# Patient Record
Sex: Male | Born: 1975 | Race: White | Hispanic: No | Marital: Married | Smoking: Current every day smoker
Health system: Southern US, Community
[De-identification: ages and names within clinical notes are randomized; demographics above are authoritative.]

## PROBLEM LIST (undated history)

## (undated) DIAGNOSIS — L729 Follicular cyst of the skin and subcutaneous tissue, unspecified: Secondary | ICD-10-CM

## (undated) DIAGNOSIS — K279 Peptic ulcer, site unspecified, unspecified as acute or chronic, without hemorrhage or perforation: Secondary | ICD-10-CM

## (undated) DIAGNOSIS — M549 Dorsalgia, unspecified: Secondary | ICD-10-CM

## (undated) DIAGNOSIS — G8929 Other chronic pain: Secondary | ICD-10-CM

## (undated) DIAGNOSIS — B001 Herpesviral vesicular dermatitis: Secondary | ICD-10-CM

## (undated) HISTORY — DX: Follicular cyst of the skin and subcutaneous tissue, unspecified: L72.9

## (undated) HISTORY — DX: Dorsalgia, unspecified: M54.9

## (undated) HISTORY — DX: Other chronic pain: G89.29

## (undated) HISTORY — DX: Herpesviral vesicular dermatitis: B00.1

## (undated) MED FILL — Medication: Fill #1 | Status: CN

---

## 1998-01-09 ENCOUNTER — Emergency Department (HOSPITAL_COMMUNITY): Admission: EM | Admit: 1998-01-09 | Discharge: 1998-01-09 | Payer: Self-pay | Admitting: Emergency Medicine

## 2000-03-22 HISTORY — PX: CHOLECYSTECTOMY: SHX55

## 2008-02-14 ENCOUNTER — Emergency Department: Payer: Self-pay | Admitting: Emergency Medicine

## 2009-11-20 ENCOUNTER — Ambulatory Visit: Payer: Self-pay | Admitting: Family Medicine

## 2010-08-05 ENCOUNTER — Ambulatory Visit: Payer: Self-pay | Admitting: Specialist

## 2013-04-10 LAB — LIPID PANEL: LDL Cholesterol: 84 mg/dL

## 2014-03-25 LAB — LIPID PANEL
Cholesterol: 247 mg/dL — AB (ref 0–200)
HDL: 34 mg/dL — AB (ref 35–70)
Triglycerides: 556 mg/dL — AB (ref 40–160)

## 2014-03-25 LAB — BASIC METABOLIC PANEL
BUN: 14 mg/dL (ref 4–21)
Creatinine: 0.9 mg/dL (ref <=1.3)
Glucose: 87 mg/dL
Sodium: 140 mmol/L (ref 137–147)

## 2014-07-29 ENCOUNTER — Emergency Department: Payer: No Typology Code available for payment source

## 2014-07-29 ENCOUNTER — Encounter: Payer: Self-pay | Admitting: Emergency Medicine

## 2014-07-29 ENCOUNTER — Emergency Department
Admission: EM | Admit: 2014-07-29 | Discharge: 2014-07-29 | Disposition: A | Discharge status: Home / Self Care | Payer: No Typology Code available for payment source | Attending: Emergency Medicine | Admitting: Emergency Medicine

## 2014-07-29 ENCOUNTER — Other Ambulatory Visit: Payer: Self-pay

## 2014-07-29 DIAGNOSIS — Z9089 Acquired absence of other organs: Secondary | ICD-10-CM | POA: Diagnosis not present

## 2014-07-29 DIAGNOSIS — R1012 Left upper quadrant pain: Secondary | ICD-10-CM

## 2014-07-29 DIAGNOSIS — Z720 Tobacco use: Secondary | ICD-10-CM | POA: Diagnosis not present

## 2014-07-29 DIAGNOSIS — Z79899 Other long term (current) drug therapy: Secondary | ICD-10-CM | POA: Diagnosis not present

## 2014-07-29 DIAGNOSIS — K2970 Gastritis, unspecified, without bleeding: Secondary | ICD-10-CM | POA: Diagnosis not present

## 2014-07-29 DIAGNOSIS — R109 Unspecified abdominal pain: Secondary | ICD-10-CM

## 2014-07-29 HISTORY — DX: Peptic ulcer, site unspecified, unspecified as acute or chronic, without hemorrhage or perforation: K27.9

## 2014-07-29 LAB — COMPREHENSIVE METABOLIC PANEL
ALBUMIN: 4.5 g/dL (ref 3.5–5.0)
ALT: 23 U/L (ref 17–63)
AST: 22 U/L (ref 15–41)
Alkaline Phosphatase: 62 U/L (ref 38–126)
Anion gap: 10 (ref 5–15)
BILIRUBIN TOTAL: 0.6 mg/dL (ref 0.3–1.2)
BUN: 21 mg/dL — ABNORMAL HIGH (ref 6–20)
CHLORIDE: 103 mmol/L (ref 101–111)
CO2: 22 mmol/L (ref 22–32)
CREATININE: 1.07 mg/dL (ref 0.61–1.24)
Calcium: 8.9 mg/dL (ref 8.9–10.3)
GFR calc Af Amer: >60 mL/min (ref >60)
GFR calc non Af Amer: >60 mL/min (ref >60)
Glucose, Bld: 110 mg/dL — ABNORMAL HIGH (ref 65–99)
Potassium: 3.3 mmol/L — ABNORMAL LOW (ref 3.5–5.1)
Sodium: 135 mmol/L (ref 135–145)
Total Protein: 7 g/dL (ref 6.5–8.1)

## 2014-07-29 LAB — CBC
HCT: 45.3 % (ref 40.0–52.0)
HEMOGLOBIN: 16.1 g/dL (ref 13.0–18.0)
MCH: 33.1 pg (ref 26.0–34.0)
MCHC: 35.5 g/dL (ref 32.0–36.0)
MCV: 93.2 fL (ref 80.0–100.0)
Platelets: 189 10*3/uL (ref 150–440)
RBC: 4.86 MIL/uL (ref 4.40–5.90)
RDW: 12.3 % (ref 11.5–14.5)
WBC: 11 10*3/uL — AB (ref 3.8–10.6)

## 2014-07-29 LAB — TROPONIN I: Troponin I: <0.03 ng/mL (ref <0.031)

## 2014-07-29 LAB — LIPASE, BLOOD: LIPASE: 26 U/L (ref 22–51)

## 2014-07-29 MED ORDER — GI COCKTAIL ~~LOC~~
ORAL | Status: AC
  Filled 2014-07-29: qty 30

## 2014-07-29 MED ORDER — OXYCODONE-ACETAMINOPHEN 5-325 MG PO TABS
ORAL_TABLET | ORAL | Status: AC
  Filled 2014-07-29: qty 1

## 2014-07-29 MED ORDER — ONDANSETRON HCL 4 MG/2ML IJ SOLN
INTRAMUSCULAR | Status: AC
  Filled 2014-07-29: qty 2

## 2014-07-29 MED ORDER — LIDOCAINE VISCOUS 2 % MT SOLN: 10.0000 mL | OROMUCOSAL | Status: DC | PRN

## 2014-07-29 MED ORDER — ONDANSETRON HCL 4 MG/2ML IJ SOLN
4.0000 mg | Freq: Once | INTRAMUSCULAR | Status: AC
  Administered 2014-07-29: 4 mg via INTRAVENOUS

## 2014-07-29 MED ORDER — FAMOTIDINE 20 MG PO TABS
ORAL_TABLET | ORAL | Status: AC
  Filled 2014-07-29: qty 1

## 2014-07-29 MED ORDER — OXYCODONE-ACETAMINOPHEN 5-325 MG PO TABS
2.0000 | ORAL_TABLET | Freq: Once | ORAL | Status: AC
  Administered 2014-07-29: 2 via ORAL

## 2014-07-29 MED ORDER — FAMOTIDINE 20 MG PO TABS
40.0000 mg | ORAL_TABLET | Freq: Once | ORAL | Status: AC
  Administered 2014-07-29: 40 mg via ORAL

## 2014-07-29 MED ORDER — MORPHINE SULFATE 4 MG/ML IJ SOLN
INTRAMUSCULAR | Status: AC
  Filled 2014-07-29: qty 1

## 2014-07-29 MED ORDER — OXYCODONE-ACETAMINOPHEN 5-325 MG PO TABS
ORAL_TABLET | ORAL | Status: AC
  Administered 2014-07-29: 2 via ORAL
  Filled 2014-07-29: qty 1

## 2014-07-29 MED ORDER — FAMOTIDINE 40 MG PO TABS: 40.0000 mg | ORAL_TABLET | Freq: Every evening | ORAL | Status: DC

## 2014-07-29 MED ORDER — MORPHINE SULFATE 4 MG/ML IJ SOLN
4.0000 mg | Freq: Once | INTRAMUSCULAR | Status: AC
  Administered 2014-07-29: 4 mg via INTRAVENOUS

## 2014-07-29 MED ORDER — GI COCKTAIL ~~LOC~~
30.0000 mL | Freq: Once | ORAL | Status: AC
  Administered 2014-07-29: 30 mL via ORAL

## 2014-07-29 MED ORDER — SODIUM CHLORIDE 0.9 % IV BOLUS (SEPSIS)
1000.0000 mL | Freq: Once | INTRAVENOUS | Status: AC
  Administered 2014-07-29: 1000 mL via INTRAVENOUS

## 2014-07-29 MED ORDER — FAMOTIDINE 20 MG PO TABS
ORAL_TABLET | ORAL | Status: AC
  Administered 2014-07-29: 40 mg via ORAL
  Filled 2014-07-29: qty 1

## 2014-07-29 NOTE — ED Notes (Signed)
Pt presents to ER alert and in NAD. Pt reports generalized abd pain, states hx of PUD. States nausea, denies vomiting, denies diarrhea. Taking Carafate at home.

## 2014-07-29 NOTE — ED Provider Notes (Signed)
[facility] Emergency Department Provider Note  ____________________________________________  Time seen: Approximately 235 AM  I have reviewed the triage vital signs and the nursing notes.   HISTORY  Chief Complaint Abdominal Pain    HPI Todd Chen is a 39 y.o. male comes in with indigestion. He reports that a few weeks ago he had some indigestion. He reports that he went to see the doctor at his physician's office and was given a prescription for Carafate. He reports that last night he ate ice cream, something healthy for lunch, and then had a [employer] sandwich. He reports that he took Prilosec and Carafate but he developed some severe epigastric and left upper quadrant abdominal pain. He reports he had similar symptoms previously but was able to alleviate the pain after vomiting. The patient reports that he also took some ibuprofen but the pain continued to worsen. He reports that he tried to lay on his stomach and felt that so he had a ball in his abdomen. He reports he is being treated for peptic ulcer disease but has not yet received an endoscopy. The patient reports that nothing he does is improving the pain so he decided to come in for further evaluation. The patient reports the pain is 8 out of 10 in intensity. He reports that it occasionally does move into his chest from his abdomen. He reports that he is breathing hard due to the pain. He denies any other complaints.   Past Medical History  Diagnosis Date  . PUD (peptic ulcer disease)   . Hypercholesteremia     There are no active problems to display for this patient.   Past Surgical History  Procedure Laterality Date  . Cholecystectomy      Current Outpatient Rx  Name  Route  Sig  Dispense  Refill  . ALPRAZolam (XANAX) 1 MG tablet   Oral   Take 1 mg by mouth at bedtime as needed for anxiety.         Marland Kitchen. atorvastatin (LIPITOR) 20 MG tablet   Oral   Take 20 mg by mouth daily.         .  sucralfate (CARAFATE) 1 G tablet   Oral   Take 1 g by mouth 4 (four) times daily -  with meals and at bedtime.         . famotidine (PEPCID) 40 MG tablet   Oral   Take 1 tablet (40 mg total) by mouth every evening.   15 tablet   1   . lidocaine (XYLOCAINE) 2 % solution   Mouth/Throat   Use as directed 10 mLs in the mouth or throat every 4 (four) hours as needed for mouth pain.   100 mL   0     Allergies Review of patient's allergies indicates no known allergies.  History reviewed. No pertinent family history.  Social History History  Substance Use Topics  . Smoking status: Current Every Day Smoker  . Smokeless tobacco: Not on file  . Alcohol Use: Yes    Review of Systems Constitutional: No fever/chills Eyes: No visual changes. ENT: No sore throat. Cardiovascular: Denies chest pain. Respiratory: shortness of breath. Gastrointestinal: abdominal pain.  nausea,  vomiting. Genitourinary: Negative for dysuria. Musculoskeletal: Negative for back pain. Skin: Negative for rash. Neurological: Negative for headaches, focal weakness or numbness. 10-point ROS otherwise negative.  ____________________________________________   PHYSICAL EXAM:  VITAL SIGNS: ED Triage Vitals  Enc Vitals Group     BP 07/29/14  0237 169/93 mmHg     Pulse Rate 07/29/14 0237 63     Resp 07/29/14 0237 18     Temp 07/29/14 0237 97.5 F (36.4 C)     Temp Source 07/29/14 0237 Oral     SpO2 07/29/14 0237 97 %     Weight 07/29/14 0237 255 lb (115.667 kg)     Height 07/29/14 0237  (1.753 m)     Head Cir --      Peak Flow --      Pain Score 07/29/14 0238 8     Pain Loc --      Pain Edu? --      Excl. in GC? --     Constitutional: Alert and oriented. Well appearing and in severe acute distress. Eyes: Conjunctivae are normal. PERRL. EOMI. Head: Atraumatic. Nose: No congestion/rhinnorhea. Mouth/Throat: Mucous membranes are moist.  Oropharynx non-erythematous. Cardiovascular: Normal  rate, regular rhythm. Grossly normal heart sounds.  Good peripheral circulation. Respiratory: Normal respiratory effort.  No retractions. Lungs CTAB. Gastrointestinal: Soft, left upper quadrant and epigastric tenderness to palpation. No distention. No abdominal bruits. No CVA tenderness. Genitourinary: Deferred Musculoskeletal: No lower extremity tenderness nor edema.  No joint effusions. Neurologic:  Normal speech and language. No gross focal neurologic deficits are appreciated. Speech is normal. No gait instability. Skin:  Skin is warm, dry and intact. No rash noted. Psychiatric: Mood and affect are normal. Speech and behavior are normal.  ____________________________________________   LABS (all labs ordered are listed, but only abnormal results are displayed)  Labs Reviewed  CBC - Abnormal; Notable for the following:    WBC 11.0 (*)    All other components within normal limits  COMPREHENSIVE METABOLIC PANEL - Abnormal; Notable for the following:    Potassium 3.3 (*)    Glucose, Bld 110 (*)    BUN 21 (*)    All other components within normal limits  LIPASE, BLOOD  TROPONIN I   ____________________________________________  EKG  ED ECG REPORT   Date: 07/29/2014  EKG Time: 418  Rate: 53  Rhythm: sinus bradycardia  Axis: Normal  Intervals:none  ST&T Change: Normal  ____________________________________________  RADIOLOGY  Chest x-ray: No active disease ____________________________________________   PROCEDURES  Procedure(s) performed: None  Critical Care performed: No  ____________________________________________   INITIAL IMPRESSION / ASSESSMENT AND PLAN / ED COURSE  Pertinent labs & imaging results that were available during my care of the patient were reviewed by me and considered in my medical decision making (see chart for details).  This is a 39 year old male who comes in with some epigastric and left upper quadrant pain. The patient has been undergoing  treatment for peptic ulcer disease. I will give the patient some medication for his discomfort. He will receive some morphine 4 mg IV 1 and Zofran 4 mg IV 1 and a GI cocktail. The patient's lipase is unremarkable and his troponin is also unremarkable.  Given the patient's symptoms he does need an endoscopy. After the medication his pain was improved although not gone. He reports his pain is approximately a 4 out of 10. The patient and his wife do feel comfortable being discharged to home and will follow-up with the GI physician for an endoscopy. The patient will receive a dose of Percocet as well as Pepcid and will be discharged home with viscous lidocaine and Pepcid. The family agreed with the plan as previously stated and will follow-up as discussed. ____________________________________________   FINAL CLINICAL IMPRESSION(S) / ED DIAGNOSES  Final diagnoses:  Gastritis  Left upper quadrant pain      Rebecka ApleyAllison P Geroge Gilliam, MD 07/29/14 [phone]

## 2014-07-29 NOTE — Discharge Instructions (Signed)
Abdominal Pain °Many things can cause abdominal pain. Usually, abdominal pain is not caused by a disease and will improve without treatment. It can often be observed and treated at home. Your health care provider will do a physical exam and possibly order blood tests and X-rays to help determine the seriousness of your pain. However, in many cases, more time must pass before a clear cause of the pain can be found. Before that point, your health care provider may not know if you need more testing or further treatment. °HOME CARE INSTRUCTIONS  °Monitor your abdominal pain for any changes. The following actions may help to alleviate any discomfort you are experiencing: °· Only take over-the-counter or prescription medicines as directed by your health care provider. °· Do not take laxatives unless directed to do so by your health care provider. °· Try a clear liquid diet (broth, tea, or water) as directed by your health care provider. Slowly move to a bland diet as tolerated. °SEEK MEDICAL CARE IF: °· You have unexplained abdominal pain. °· You have abdominal pain associated with nausea or diarrhea. °· You have pain when you urinate or have a bowel movement. °· You experience abdominal pain that wakes you in the night. °· You have abdominal pain that is worsened or improved by eating food. °· You have abdominal pain that is worsened with eating fatty foods. °· You have a fever. °SEEK IMMEDIATE MEDICAL CARE IF:  °· Your pain does not go away within 2 hours. °· You keep throwing up (vomiting). °· Your pain is felt only in portions of the abdomen, such as the right side or the left lower portion of the abdomen. °· You pass bloody or black tarry stools. °MAKE SURE YOU: °· Understand these instructions.   °· Will watch your condition.   °· Will get help right away if you are not doing well or get worse.   °Document Released: 12/16/2004 Document Revised: 03/13/2013 Document Reviewed: 11/15/2012 °ExitCare® Patient Information  ©2015 [employer]. This information is not intended to replace advice given to you by your health care provider. Make sure you discuss any questions you have with your health care provider. ° °Gastritis, Adult °Gastritis is soreness and swelling (inflammation) of the lining of the stomach. Gastritis can develop as a sudden onset (acute) or long-term (chronic) condition. If gastritis is not treated, it can lead to stomach bleeding and ulcers. °CAUSES  °Gastritis occurs when the stomach lining is weak or damaged. Digestive juices from the stomach then inflame the weakened stomach lining. The stomach lining may be weak or damaged due to viral or bacterial infections. One common bacterial infection is the Helicobacter pylori infection. Gastritis can also result from excessive alcohol consumption, taking certain medicines, or having too much acid in the stomach.  °SYMPTOMS  °In some cases, there are no symptoms. When symptoms are present, they may include: °· Pain or a burning sensation in the upper abdomen. °· Nausea. °· Vomiting. °· An uncomfortable feeling of fullness after eating. °DIAGNOSIS  °Your caregiver may suspect you have gastritis based on your symptoms and a physical exam. To determine the cause of your gastritis, your caregiver may perform the following: °· Blood or stool tests to check for the H pylori bacterium. °· Gastroscopy. A thin, flexible tube (endoscope) is passed down the esophagus and into the stomach. The endoscope has a light and camera on the end. Your caregiver uses the endoscope to view the inside of the stomach. °· Taking a tissue sample (biopsy)   from the stomach to examine under a microscope. °TREATMENT  °Depending on the cause of your gastritis, medicines may be prescribed. If you have a bacterial infection, such as an H pylori infection, antibiotics may be given. If your gastritis is caused by too much acid in the stomach, H2 blockers or antacids may be given. Your caregiver may  recommend that you stop taking aspirin, ibuprofen, or other nonsteroidal anti-inflammatory drugs (NSAIDs). °HOME CARE INSTRUCTIONS °· Only take over-the-counter or prescription medicines as directed by your caregiver. °· If you were given antibiotic medicines, take them as directed. Finish them even if you start to feel better. °· Drink enough fluids to keep your urine clear or pale yellow. °· Avoid foods and drinks that make your symptoms worse, such as: °¨ Caffeine or alcoholic drinks. °¨ Chocolate. °¨ Peppermint or mint flavorings. °¨ Garlic and onions. °¨ Spicy foods. °¨ Citrus fruits, such as oranges, lemons, or limes. °¨ Tomato-based foods such as sauce, chili, salsa, and pizza. °¨ Fried and fatty foods. °· Eat small, frequent meals instead of large meals. °SEEK IMMEDIATE MEDICAL CARE IF:  °· You have black or dark red stools. °· You vomit blood or material that looks like coffee grounds. °· You are unable to keep fluids down. °· Your abdominal pain gets worse. °· You have a fever. °· You do not feel better after 1 week. °· You have any other questions or concerns. °MAKE SURE YOU: °· Understand these instructions. °· Will watch your condition. °· Will get help right away if you are not doing well or get worse. °Document Released: 03/02/2001 Document Revised: 09/07/2011 Document Reviewed: 04/21/2011 °ExitCare® Patient Information ©2015 [employer]. This information is not intended to replace advice given to you by your health care provider. Make sure you discuss any questions you have with your health care provider. ° °

## 2014-08-28 ENCOUNTER — Ambulatory Visit (INDEPENDENT_AMBULATORY_CARE_PROVIDER_SITE_OTHER): Payer: Self-pay | Admitting: Family Medicine

## 2014-08-28 ENCOUNTER — Encounter: Payer: Self-pay | Admitting: Family Medicine

## 2014-08-28 VITALS — BP 114/74 | HR 80 | Temp 98.4°F | Resp 16 | Wt 250.0 lb

## 2014-08-28 DIAGNOSIS — M549 Dorsalgia, unspecified: Secondary | ICD-10-CM | POA: Insufficient documentation

## 2014-08-28 DIAGNOSIS — F39 Unspecified mood [affective] disorder: Secondary | ICD-10-CM | POA: Insufficient documentation

## 2014-08-28 DIAGNOSIS — F411 Generalized anxiety disorder: Secondary | ICD-10-CM | POA: Insufficient documentation

## 2014-08-28 DIAGNOSIS — K219 Gastro-esophageal reflux disease without esophagitis: Secondary | ICD-10-CM | POA: Insufficient documentation

## 2014-08-28 DIAGNOSIS — R079 Chest pain, unspecified: Secondary | ICD-10-CM | POA: Insufficient documentation

## 2014-08-28 DIAGNOSIS — G4700 Insomnia, unspecified: Secondary | ICD-10-CM | POA: Insufficient documentation

## 2014-08-28 DIAGNOSIS — J40 Bronchitis, not specified as acute or chronic: Secondary | ICD-10-CM | POA: Insufficient documentation

## 2014-08-28 DIAGNOSIS — L723 Sebaceous cyst: Secondary | ICD-10-CM | POA: Insufficient documentation

## 2014-08-28 DIAGNOSIS — B001 Herpesviral vesicular dermatitis: Secondary | ICD-10-CM | POA: Insufficient documentation

## 2014-08-28 DIAGNOSIS — E782 Mixed hyperlipidemia: Secondary | ICD-10-CM | POA: Insufficient documentation

## 2014-08-28 DIAGNOSIS — R0602 Shortness of breath: Secondary | ICD-10-CM | POA: Insufficient documentation

## 2014-08-28 MED ORDER — SUCRALFATE 1 G PO TABS: 1.0000 g | ORAL_TABLET | Freq: Three times a day (TID) | ORAL | Status: DC

## 2014-08-28 NOTE — Progress Notes (Signed)
Patient: Todd Chen Male    DOB: 03/13/1976   39 y.o.   MRN: [id number] Visit Date: 08/28/2014  Today's Provider: Mila Merryonald [name], MD   No chief complaint on file.  Subjective:    HPI    GERD, Follow up:  The patient was last seen for GERD 1 months ago. He had been to ER and put on Dexilant and Sucrafate and sx resolved.  He has stopped OTC ibuprofen. He has been to GI and no further evaluation was advised  H.pyroli serology was negative Changes made since that visit include none . He is rarely drinking alcohol.   He reports good compliance with treatment. He is not having side effects. Marland Kitchen.  He IS experiencing no reflux symptoms. He is NOT experiencing chest pain, cough, difficulty swallowing, fullness after meals or heartburn  ------------------------------------------------------------------------   Insomnia  He presents today for evaluation of insomnia. Onset was  weeks ago. Insomnia is getting unchanged. He has trouble sleeping several times a week.   He has difficulty FALLING asleep. He has difficulty STAYING asleep. He does not wake frequently to urinate. He does not have urge to move legs when resting. He is not having pain when trying to sleep  He is having anxiety. He is having a lot of stress in his life. Marland Kitchen. He is not having depression.  He is not taking stimulant medications.  He is not taking new medications:  He is taking OTC sleeping aid. (melatonin). He is taking medications to help sleep. He is not drinking alcohol to help sleep. He is not using illicit drugs.  --------------------------------------------------------------------        Previous Medications   ALBUTEROL (VENTOLIN HFA) 108 (90 BASE) MCG/ACT INHALER    Inhale into the lungs.   ALPRAZOLAM (XANAX) 1 MG TABLET       ATORVASTATIN (LIPITOR) 20 MG TABLET    Take 20 mg by mouth daily.   FAMOTIDINE (PEPCID) 40 MG TABLET    Take 1 tablet (40 mg total) by mouth every evening.    LIDOCAINE (XYLOCAINE) 2 % SOLUTION    Use as directed 10 mLs in the mouth or throat every 4 (four) hours as needed for mouth pain.   LIDOCAINE HCL 2 % SOLN    Take by mouth.   MELATONIN 10 MG TABS    Take by mouth.   SUCRALFATE (CARAFATE) 1 G TABLET    Take by mouth.   VALACYCLOVIR (VALTREX) 1000 MG TABLET    Take by mouth.    Review of Systems  Constitutional: Negative for appetite change and fatigue.  Respiratory: Negative for choking, chest tightness, shortness of breath and wheezing.   Cardiovascular: Negative for chest pain, palpitations and leg swelling.  Gastrointestinal: Negative for nausea, abdominal pain, diarrhea, constipation and blood in stool.  Genitourinary: Negative for urgency and frequency.  Psychiatric/Behavioral: Positive for sleep disturbance.    History  Substance Use Topics  . Smoking status: Current Every Day Smoker -- 2.00 packs/day    Types: Cigarettes  . Smokeless tobacco: Not on file     Comment: Quit in January 2013, and started back in March.  . Alcohol Use: 0.0 oz/week    0 Standard drinks or equivalent per week   Objective:   There were no vitals taken for this visit.  Physical Exam   General Appearance:    Alert, cooperative, no distress, obese  Eyes:    PERRL, conjunctiva/corneas clear, EOM's intact       Lungs:  Clear to auscultation bilaterally, respirations unlabored  Heart:    Regular rate and rhythm  Neurologic:   Awake, alert, oriented x 3. No apparent focal neurological           defect.               Assessment & Plan:     1. Gastroesophageal reflux disease, esophagitis presence not specified Much improved on carafate and pepcid. Continue current medications.    - sucralfate (CARAFATE) 1 G tablet; Take 1 tablet (1 g total) by mouth 4 (four) times daily -  with meals and at bedtime. As needed  Dispense: 60 tablet; Refill: 1  2. Anxiety, generalized Has had considerably more stress lately, but alprazolam working fairly well.  Advised not to take more often than prescribed.   3. Insomnia Continue alprazolam at night, melatonin, and occasional alprazolam during th day or anxiety.

## 2014-09-09 ENCOUNTER — Other Ambulatory Visit: Payer: Self-pay | Admitting: Family Medicine

## 2014-09-09 DIAGNOSIS — F411 Generalized anxiety disorder: Secondary | ICD-10-CM

## 2014-09-09 NOTE — Telephone Encounter (Signed)
Please call in  [facility] [phone]   Surescripts Out Interface  9 minutes ago   (8:35 PM)       Requested Medications     Medication name:  Name from pharmacy:  ALPRAZolam (XANAX) 1 MG tablet ALPRAZOLAM 1MG  TAB    Sig: TAKE 1 TO 2 TABLETS ONE TIME DAILY AS NEEDED    Dispense: 30 tablet (Pharmacy requested 30 each)   RFx3

## 2014-09-10 NOTE — Telephone Encounter (Signed)
rx called in.   Thanks,   -Kealan Buchan  

## 2014-10-16 ENCOUNTER — Other Ambulatory Visit: Payer: Self-pay | Admitting: Family Medicine

## 2014-10-16 NOTE — Telephone Encounter (Signed)
Last office visit was on 08/28/2014.

## 2014-10-16 NOTE — Telephone Encounter (Signed)
See patient request for refill on Valtrex

## 2014-11-15 ENCOUNTER — Other Ambulatory Visit: Payer: Self-pay | Admitting: Family Medicine

## 2014-11-28 ENCOUNTER — Other Ambulatory Visit: Payer: Self-pay | Admitting: Family Medicine

## 2014-12-12 ENCOUNTER — Other Ambulatory Visit: Payer: Self-pay | Admitting: *Deleted

## 2014-12-12 DIAGNOSIS — F411 Generalized anxiety disorder: Secondary | ICD-10-CM

## 2014-12-13 MED ORDER — ALPRAZOLAM 1 MG PO TABS: ORAL_TABLET | ORAL | Status: DC

## 2014-12-13 NOTE — Telephone Encounter (Signed)
Please call in alprazolam.  

## 2014-12-13 NOTE — Telephone Encounter (Signed)
Rx called in to pharmacy. 

## 2015-02-05 ENCOUNTER — Other Ambulatory Visit: Payer: Self-pay | Admitting: Family Medicine

## 2015-02-06 NOTE — Telephone Encounter (Signed)
Please call in alprazolam.  

## 2015-02-07 ENCOUNTER — Other Ambulatory Visit: Payer: Self-pay

## 2015-02-07 MED ORDER — ALPRAZOLAM 1 MG PO TABS: ORAL_TABLET | ORAL | Status: DC

## 2015-02-07 NOTE — Telephone Encounter (Signed)
Prescription called in

## 2015-02-07 NOTE — Telephone Encounter (Signed)
Please call in alprazolam.  

## 2015-02-07 NOTE — Telephone Encounter (Signed)
Pharmacy is requesting medication to be refilled at the appropriate time. Hard copy on desk. Thanks!

## 2015-02-07 NOTE — Telephone Encounter (Signed)
Done. Prescription called into pharmacy.  

## 2015-03-13 ENCOUNTER — Other Ambulatory Visit: Payer: Self-pay | Admitting: Family Medicine

## 2015-03-14 NOTE — Telephone Encounter (Signed)
Rx called in to pharmacy. 

## 2015-03-14 NOTE — Telephone Encounter (Signed)
Please call in alprazolam.  

## 2015-04-15 ENCOUNTER — Encounter: Payer: Self-pay | Admitting: Family Medicine

## 2015-04-15 ENCOUNTER — Ambulatory Visit (INDEPENDENT_AMBULATORY_CARE_PROVIDER_SITE_OTHER): Payer: 59 | Admitting: Family Medicine

## 2015-04-15 VITALS — BP 128/82 | HR 80 | Temp 98.3°F | Resp 16 | Ht 69.0 in | Wt 268.0 lb

## 2015-04-15 DIAGNOSIS — F39 Unspecified mood [affective] disorder: Secondary | ICD-10-CM

## 2015-04-15 DIAGNOSIS — G4700 Insomnia, unspecified: Secondary | ICD-10-CM

## 2015-04-15 DIAGNOSIS — L301 Dyshidrosis [pompholyx]: Secondary | ICD-10-CM | POA: Diagnosis not present

## 2015-04-15 MED ORDER — ESZOPICLONE 3 MG PO TABS: 3.0000 mg | ORAL_TABLET | Freq: Every day | ORAL | Status: DC

## 2015-04-15 MED ORDER — TRIAMCINOLONE ACETONIDE 0.1 % EX CREA: 1.0000 "application " | TOPICAL_CREAM | Freq: Two times a day (BID) | CUTANEOUS | Status: DC

## 2015-04-15 NOTE — Progress Notes (Signed)
Patient ID: Todd Chen, male   DOB: May 06, 1975, 40 y.o.   MRN: [id number]       Patient: Todd Chen Male    DOB: 18-Jun-1975   39 y.o.   MRN: [id number] Visit Date: 04/15/2015  Today's Provider: Mila Merry, MD   Chief Complaint  Patient presents with  . Insomnia    worsened over the last week.   Marland Kitchen Anxiety   Subjective:    Insomnia Primary symptoms: difficulty falling asleep, malaise/fatigue.  The current episode started in the past 7 days. The problem occurs intermittently. The problem has been gradually worsening since onset. The symptoms are aggravated by anxiety and work stress. How many beverages per day that contain caffeine: 2-3.  Types of beverages you drink: coffee, soda and tea. Past treatments include medication (Melatoin ). The treatment provided no relief. Typical bedtime:  Shift work-varies.  PMH includes: work related stressors.  Anxiety The problem has been gradually worsening. Symptoms include insomnia and nervous/anxious behavior. Patient reports no decreased concentration or suicidal ideas. The quality of sleep is poor. Nighttime awakenings: several.    Patient reports that he has increased stress and anxiety over the last week due to conflicts at work. He reports that last night was the first night in over 10 years that he has not slept at all. Patient reports that he has been awake for over 24 hours. Patient reports that he takes OTC Melatonin and Alprazolam to help with sleeping, but recently it has not been effective. He has felt a little depressed lately which he thinks is also due to conflicts at work. He took Wellbutrin and Prozac in the past, but states he had some side effects.  He has also had scaly rash on hands the last month or so which improved slightly with moisturizing lotions.      Allergies  Allergen Reactions  . Chantix  [Varenicline]     Crazy Dreams  . Wellbutrin Sr  [Bupropion Hcl]     Stomach pains   Previous Medications   ALPRAZOLAM (XANAX) 1 MG TABLET    TAKE 1 TO 2 TABLETS ONE TIME DAILY AS NEEDED   ATORVASTATIN (LIPITOR) 20 MG TABLET    Take 20 mg by mouth daily. Reported on 04/15/2015   FAMOTIDINE (PEPCID) 40 MG TABLET    Take 1 tablet (40 mg total) by mouth every evening.   LIDOCAINE (XYLOCAINE) 2 % SOLUTION    Use as directed 10 mLs in the mouth or throat every 4 (four) hours as needed for mouth pain.   LIDOCAINE HCL 2 % SOLN    Take by mouth. Reported on 04/15/2015   MELATONIN 10 MG TABS    Take by mouth.   SUCRALFATE (CARAFATE) 1 G TABLET    TAKE ONE TABLET FOUR TIMES A DAY WITH MEALS AND AT BEDTIME AS NEEDED   VALACYCLOVIR (VALTREX) 1000 MG TABLET    TAKE 2 TABLETS BY MOUTH TWICE DAILY AS NEEDED   VENTOLIN HFA 108 (90 BASE) MCG/ACT INHALER    USE TWO PUFFS EVERY SIX HOURS AS NEEDED    Review of Systems  Constitutional: Positive for malaise/fatigue.  Psychiatric/Behavioral: Negative for suicidal ideas and decreased concentration. The patient is nervous/anxious and has insomnia.     Social History  Substance Use Topics  . Smoking status: Current Every Day Smoker -- 2.00 packs/day    Types: Cigarettes  . Smokeless tobacco: Not on file     Comment: Quit in January 2013, and started back  in March.  . Alcohol Use: 0.0 oz/week    0 Standard drinks or equivalent per week   Objective:   BP 128/82 mmHg  Pulse 80  Temp(Src) 98.3 F (36.8 C)  Resp 16  Ht  (1.753 m)  Wt 268 lb (121.564 kg)  BMI 39.56 kg/m2  Physical Exam   General Appearance:    Alert, cooperative, no distress  Eyes:    PERRL, conjunctiva/corneas clear, EOM's intact       Lungs:     Clear to auscultation bilaterally, respirations unlabored  Heart:    Regular rate and rhythm  Neurologic:   Awake, alert, oriented x 3. No apparent focal neurological           defect.   Skin:   Several patches of hyperkeratosis over both hands. No erythema.         Assessment & Plan:     1. Insomnia Can try Lunesta. Advised not to mix with  alprazolam or any other OTC sedatives, although he could take with  melatonin.  - Eszopiclone 3 MG TABS; Take 1 tablet (3 mg total) by mouth at bedtime. May take up to 4 nights a week. Take immediately before bedtime  Dispense: 20 tablet; Refill: 1  2. Episodic mood disorder (HCC)  Discussed trial of another SSRI. 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   3. Dyshidrotic eczema  - triamcinolone cream (KENALOG) 0.1 %; Apply 1 application topically 2 (two) times daily.  Dispense: 45 g; Refill: 0  Consider dermatology referral if not rapidly improving.       Mila Merry, MD  [facility]

## 2015-05-05 ENCOUNTER — Other Ambulatory Visit: Payer: Self-pay | Admitting: Family Medicine

## 2015-05-06 NOTE — Telephone Encounter (Signed)
Rx called in to pharmacy. 

## 2015-05-06 NOTE — Telephone Encounter (Signed)
Please call in alprazolam.  

## 2015-05-12 ENCOUNTER — Ambulatory Visit: Payer: Self-pay | Admitting: Family Medicine

## 2015-05-12 ENCOUNTER — Telehealth: Payer: Self-pay

## 2015-05-12 NOTE — Telephone Encounter (Signed)
Patient reports that he has had itching all over his body since last night. He reports that after he ate the [employer] from [employer], about 1 hour later his skin became really itchy. Patient denies a rash, shortness of breath, swelling, or sensation of throat closing. He reports that he took 2 benadryl tablets about ago and reports that the itching has decreased from a 9 to a 6. Scheduled patient an appt to be seen today. Patient reports that if his symptoms continue to improve, he will call back and cancel appt.

## 2015-05-22 ENCOUNTER — Encounter (INDEPENDENT_AMBULATORY_CARE_PROVIDER_SITE_OTHER): Payer: Self-pay | Admitting: Family Medicine

## 2015-05-22 DIAGNOSIS — Z029 Encounter for administrative examinations, unspecified: Secondary | ICD-10-CM

## 2015-05-22 NOTE — Progress Notes (Unsigned)
[employer] Class FAA exam

## 2015-06-26 ENCOUNTER — Other Ambulatory Visit: Payer: Self-pay | Admitting: Family Medicine

## 2015-06-27 NOTE — Telephone Encounter (Signed)
Prescription phoned into pharmacy.

## 2015-06-27 NOTE — Telephone Encounter (Signed)
Please call in alprazolam.  

## 2015-08-08 DIAGNOSIS — H52223 Regular astigmatism, bilateral: Secondary | ICD-10-CM | POA: Diagnosis not present

## 2015-10-01 ENCOUNTER — Other Ambulatory Visit: Payer: Self-pay

## 2015-10-01 NOTE — Telephone Encounter (Signed)
Pharmacy requesting refills. Thanks!  

## 2015-10-02 MED ORDER — ALPRAZOLAM 1 MG PO TABS: ORAL_TABLET | ORAL | Status: DC

## 2015-10-02 NOTE — Telephone Encounter (Signed)
Please call in alprazolam.  

## 2015-10-21 ENCOUNTER — Other Ambulatory Visit: Payer: Self-pay | Admitting: Family Medicine

## 2015-10-21 DIAGNOSIS — B001 Herpesviral vesicular dermatitis: Secondary | ICD-10-CM

## 2015-12-09 ENCOUNTER — Other Ambulatory Visit: Payer: Self-pay | Admitting: *Deleted

## 2015-12-09 MED ORDER — ALPRAZOLAM 1 MG PO TABS: ORAL_TABLET | ORAL | 3 refills | Status: DC

## 2015-12-09 NOTE — Telephone Encounter (Signed)
Please call in alprazolam.  

## 2015-12-09 NOTE — Telephone Encounter (Signed)
Rx called in to pharmacy. 

## 2016-02-25 ENCOUNTER — Other Ambulatory Visit: Payer: Self-pay | Admitting: *Deleted

## 2016-02-25 MED ORDER — ALPRAZOLAM 1 MG PO TABS: ORAL_TABLET | ORAL | 3 refills | Status: DC

## 2016-02-25 NOTE — Telephone Encounter (Signed)
Please call in alprazolam.  

## 2016-02-26 NOTE — Telephone Encounter (Signed)
Rx called in to pharmacy. 

## 2016-04-19 ENCOUNTER — Other Ambulatory Visit: Payer: Self-pay | Admitting: Family Medicine

## 2016-04-19 NOTE — Telephone Encounter (Signed)
[facility]. Thanks!

## 2016-05-20 ENCOUNTER — Other Ambulatory Visit: Payer: Self-pay | Admitting: Family Medicine

## 2016-05-20 NOTE — Telephone Encounter (Signed)
Please call in alprazolam.  

## 2016-05-20 NOTE — Telephone Encounter (Signed)
Done  ED 

## 2016-05-31 ENCOUNTER — Other Ambulatory Visit: Payer: Self-pay | Admitting: Family Medicine

## 2016-07-19 ENCOUNTER — Other Ambulatory Visit: Payer: Self-pay | Admitting: Family Medicine

## 2016-07-19 NOTE — Telephone Encounter (Signed)
Please call in alprazolam.  

## 2016-07-20 NOTE — Telephone Encounter (Signed)
Rx called in to pharmacy. 

## 2016-08-04 ENCOUNTER — Telehealth: Payer: Self-pay | Admitting: Family Medicine

## 2016-08-04 NOTE — Telephone Encounter (Signed)
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  [phone] Todd Chen

## 2016-08-04 NOTE — Telephone Encounter (Signed)
Spoke with patient and advised the FAA was submitted electronically and that we do not keep this information on file.  Advised patient to [facility].  Patient agreed.

## 2016-08-04 NOTE — Telephone Encounter (Signed)
Unable to find anything in the system. [facility] for [facility] assistance.

## 2016-10-07 ENCOUNTER — Other Ambulatory Visit: Payer: Self-pay | Admitting: Family Medicine

## 2016-10-08 NOTE — Telephone Encounter (Signed)
Left detailed message on pt's vm advising him to cb to schedule f/u.

## 2016-10-08 NOTE — Telephone Encounter (Signed)
Patient not seen in over a year. Needs to schedule o.v. Ok to call in alprazolam to get by until o.v.

## 2016-10-08 NOTE — Telephone Encounter (Signed)
Rx called in to pharmacy. 

## 2016-10-11 ENCOUNTER — Encounter: Payer: Self-pay | Admitting: Family Medicine

## 2016-10-11 ENCOUNTER — Ambulatory Visit: Payer: 59 | Admitting: Family Medicine

## 2016-10-11 ENCOUNTER — Ambulatory Visit (INDEPENDENT_AMBULATORY_CARE_PROVIDER_SITE_OTHER): Payer: 59 | Admitting: Family Medicine

## 2016-10-11 VITALS — BP 140/88 | HR 96 | Temp 98.5°F | Resp 16 | Wt 278.0 lb

## 2016-10-11 DIAGNOSIS — E781 Pure hyperglyceridemia: Secondary | ICD-10-CM | POA: Diagnosis not present

## 2016-10-11 DIAGNOSIS — F39 Unspecified mood [affective] disorder: Secondary | ICD-10-CM | POA: Diagnosis not present

## 2016-10-11 DIAGNOSIS — Z720 Tobacco use: Secondary | ICD-10-CM

## 2016-10-11 NOTE — Progress Notes (Signed)
Patient: Todd Chen Male    DOB: 1975/11/25   41 y.o.   MRN: [id number] Visit Date: 10/11/2016  Today's Provider: Mila Merry, MD   Chief Complaint  Patient presents with  . Anxiety    follow up  . Insomnia    follow up   Subjective:    HPI Follow up of Anxiety:  Current treatment includes Alprazolam. Patient reports good compliance with treatment, good tolerance and good symptom control. 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 Is taking alprazolam during the day occasionally, in addition to every night as below.    Patient was last seen for this problem over 1 year ago. Management during that visit includes starting Eszopiclone. Today patient comes in reporting that he has not been taking Eszopiclone due to his concerns for possible side effects. Has started taking a full alprazolam every night at bedtime which is helps sleep for up to six hours.   Shortness of Breath:  Patient reports this is a chronic problem. He currently uses Ventolin inhaler which helps improve symptoms. Patient still smokes 2ppd.    Follow up hyperlipidemia.  Was previously prescribed atorvastatin she he ran out of last year and hasn't had refilled since then. He states he thinks it may have made him feel a little weird.   Lab Results  Component Value Date   CHOL 247 (A) 03/25/2014   HDL 34 (A) 03/25/2014   LDLCALC 84 04/10/2013   TRIG 556 (A) 03/25/2014        Allergies  Allergen Reactions  . Chantix  [Varenicline]     Crazy Dreams  . Wellbutrin Sr  [Bupropion Hcl]     Stomach pains     Current Outpatient Prescriptions:  .  ALPRAZolam (XANAX) 1 MG tablet, TAKE 1-2 TABLETS BY MOUTH DAILY AS NEEDED, Disp: 30 tablet, Rfl: 1 .  Eszopiclone 3 MG TABS, Take 1 tablet (3 mg total) by mouth at bedtime. May take up to 4 nights a week. Take immediately before bedtime, Disp: 20 tablet, Rfl: 1 .  Melatonin 10 MG TABS, Take by  mouth., Disp: , Rfl:  .  valACYclovir (VALTREX) 1000 MG tablet, TAKE 2 TABLETS BY MOUTH TWICE DAILY AS NEEDED, Disp: 30 tablet, Rfl: 5 .  VENTOLIN HFA 108 (90 Base) MCG/ACT inhaler, USE TWO PUFFS EVERY SIX HOURS AS NEEDED, Disp: 18 g, Rfl: 4 .  atorvastatin (LIPITOR) 20 MG tablet, Take 20 mg by mouth daily. Reported on 04/15/2015, Disp: , Rfl:   Review of Systems  Constitutional: Negative for appetite change, chills and fever.  Respiratory: Positive for shortness of breath and wheezing. Negative for chest tightness.   Cardiovascular: Negative for chest pain and palpitations.  Gastrointestinal: Negative for abdominal pain, nausea and vomiting.  Psychiatric/Behavioral: The patient is nervous/anxious.     Social History  Substance Use Topics  . Smoking status: Current Every Day Smoker    Packs/day: 2.00    Types: Cigarettes  . Smokeless tobacco: Never Used     Comment: Quit in January 2013, and started back in March.  . Alcohol use 0.0 oz/week     Comment: a couple of beers each week   Objective:   BP 140/88 (BP Location: Right Arm, Cuff Size: Large)   Pulse 96   Temp 98.5 F (36.9 C) (Oral)   Resp 16   Wt 278 lb (126.1 kg)   SpO2 98%  Comment: room air  BMI 39.66 kg/m  Vitals:   10/11/16 1511 10/11/16 1515  BP: (!) 142/88 140/88  Pulse: 96   Resp: 16   Temp: 98.5 F (36.9 C)   TempSrc: Oral   SpO2: 98%   Weight: 278 lb (126.1 kg)      Physical Exam  General appearance: alert, well developed, well nourished, cooperative and in no distress, obese Head: Normocephalic, without obvious abnormality, atraumatic Respiratory: Respirations even and unlabored, normal respiratory rate Extremities: No gross deformities Skin: Skin color, texture, turgor normal. No rashes seen  Psych: Appropriate mood and affect. Neurologic: Mental status: Alert, oriented to person, place, and time, thought content appropriate.     Assessment & Plan:     1. Hyperglyceridemia, pure Off  atorvastatin since last year. Consider trial of rosuvastatin after reviewing labs.  - Lipid panel - Comprehensive metabolic panel - TSH  2. Episodic mood disorder (HCC) He states he took fluoxetine years ago which he did not tolerate due to sexual side effects. He feels that alprazolam works well and he takes effort to limit how often he uses it. May be a candidate for buspirone.   3. Tobacco abuse Needs to work on smoking cessation.   The entirety of the information documented in the History of Present Illness, Review of Systems and Physical Exam were personally obtained by me. Portions of this information were initially documented by Awilda Billoshena Chambers, CMA and reviewed by me for thoroughness and accuracy.         Mila Merryonald Johnmark Geiger, MD  [facility]

## 2016-10-26 ENCOUNTER — Other Ambulatory Visit: Payer: Self-pay | Admitting: Family Medicine

## 2016-10-28 NOTE — Telephone Encounter (Signed)
Please call in alprazolam.  

## 2016-10-28 NOTE — Telephone Encounter (Signed)
rx called in-aa 

## 2016-11-24 DIAGNOSIS — E781 Pure hyperglyceridemia: Secondary | ICD-10-CM | POA: Diagnosis not present

## 2016-11-25 LAB — COMPREHENSIVE METABOLIC PANEL
A/G RATIO: 2.1 (ref 1.2–2.2)
ALK PHOS: 87 IU/L (ref 39–117)
ALT: 28 IU/L (ref 0–44)
AST: 26 IU/L (ref 0–40)
Albumin: 4.8 g/dL (ref 3.5–5.5)
BUN/Creatinine Ratio: 19 (ref 9–20)
BUN: 15 mg/dL (ref 6–20)
Bilirubin Total: 0.3 mg/dL (ref 0.0–1.2)
CALCIUM: 9.6 mg/dL (ref 8.7–10.2)
CO2: 21 mmol/L (ref 20–29)
CREATININE: 0.8 mg/dL (ref 0.76–1.27)
Chloride: 101 mmol/L (ref 96–106)
GFR calc Af Amer: 131 mL/min/{1.73_m2} (ref >59)
GFR calc non Af Amer: 113 mL/min/{1.73_m2} (ref >59)
GLOBULIN, TOTAL: 2.3 g/dL (ref 1.5–4.5)
Glucose: 96 mg/dL (ref 65–99)
POTASSIUM: 4.6 mmol/L (ref 3.5–5.2)
SODIUM: 138 mmol/L (ref 134–144)
Total Protein: 7.1 g/dL (ref 6.0–8.5)

## 2016-11-25 LAB — LIPID PANEL
CHOL/HDL RATIO: 8.4 ratio — AB (ref 0.0–5.0)
CHOLESTEROL TOTAL: 245 mg/dL — AB (ref 100–199)
HDL: 29 mg/dL — ABNORMAL LOW (ref >39)
Triglycerides: 815 mg/dL (ref 0–149)

## 2016-11-25 LAB — TSH: TSH: 0.897 u[IU]/mL (ref 0.450–4.500)

## 2016-11-26 ENCOUNTER — Ambulatory Visit
Admission: RE | Admit: 2016-11-26 | Discharge: 2016-11-26 | Disposition: A | Discharge status: Home / Self Care | Payer: 59 | Source: Ambulatory Visit | Attending: Family Medicine | Admitting: Family Medicine

## 2016-11-26 ENCOUNTER — Ambulatory Visit (INDEPENDENT_AMBULATORY_CARE_PROVIDER_SITE_OTHER): Payer: 59 | Admitting: Family Medicine

## 2016-11-26 ENCOUNTER — Encounter: Payer: Self-pay | Admitting: Family Medicine

## 2016-11-26 VITALS — BP 130/70 | HR 92 | Temp 98.2°F | Resp 16 | Ht 70.0 in | Wt 281.0 lb

## 2016-11-26 DIAGNOSIS — Z23 Encounter for immunization: Secondary | ICD-10-CM

## 2016-11-26 DIAGNOSIS — R0602 Shortness of breath: Secondary | ICD-10-CM

## 2016-11-26 DIAGNOSIS — E782 Mixed hyperlipidemia: Secondary | ICD-10-CM | POA: Diagnosis not present

## 2016-11-26 DIAGNOSIS — F411 Generalized anxiety disorder: Secondary | ICD-10-CM | POA: Diagnosis not present

## 2016-11-26 DIAGNOSIS — Z720 Tobacco use: Secondary | ICD-10-CM | POA: Diagnosis not present

## 2016-11-26 DIAGNOSIS — R072 Precordial pain: Secondary | ICD-10-CM | POA: Insufficient documentation

## 2016-11-26 DIAGNOSIS — R0600 Dyspnea, unspecified: Secondary | ICD-10-CM | POA: Diagnosis not present

## 2016-11-26 MED ORDER — MOMETASONE FUROATE 100 MCG/ACT IN AERO: 2.0000 | INHALATION_SPRAY | Freq: Two times a day (BID) | RESPIRATORY_TRACT | 5 refills | Status: DC

## 2016-11-26 MED ORDER — ATORVASTATIN CALCIUM 20 MG PO TABS: 20.0000 mg | ORAL_TABLET | Freq: Every day | ORAL | 12 refills | Status: DC

## 2016-11-26 NOTE — Progress Notes (Signed)
Patient: Todd Chen Male    DOB: 11-Jan-1976   41 y.o.   MRN: [id number] Visit Date: 11/26/2016  Today's Provider: Mila Merry, MD   Chief Complaint  Patient presents with  . Shortness of Breath  . Wheezing   Subjective:    Patient has been having increased sob and wheezing more lately. Patient states he is a very a very heavy smoker, and believes this may be the cause. Patient stated that he has been using his inhaler more often also lately, usually in the morning.    Complains of mid sternal chest pain occurring at rest and with exertion. Has also been under a great deal of stress.   Has also been off of atorvastatin Lab Results  Component Value Date   CHOL 245 (H) 11/24/2016   HDL 29 (L) 11/24/2016   LDLCALC Comment 11/24/2016   TRIG 815 (HH) 11/24/2016   CHOLHDL 8.4 (H) 11/24/2016       Allergies  Allergen Reactions  . Chantix  [Varenicline]     Crazy Dreams  . Wellbutrin Sr  [Bupropion Hcl]     Stomach pains     Current Outpatient Prescriptions:  .  ALPRAZolam (XANAX) 1 MG tablet, TAKE 1-2 TABLETS BY MOUTH DAILY AS NEEDED, Disp: 30 tablet, Rfl: 3 .  Melatonin 10 MG TABS, Take by mouth., Disp: , Rfl:  .  sucralfate (CARAFATE) 1 g tablet, Take 1 g by mouth 4 (four) times daily as needed., Disp: , Rfl:  .  valACYclovir (VALTREX) 1000 MG tablet, TAKE 2 TABLETS BY MOUTH TWICE DAILY AS NEEDED AS DIRECTED BY PHYSICIAN., Disp: 30 tablet, Rfl: 12 .  VENTOLIN HFA 108 (90 Base) MCG/ACT inhaler, USE TWO PUFFS EVERY SIX HOURS AS NEEDED, Disp: 18 g, Rfl: 4 .  atorvastatin (LIPITOR) 20 MG tablet, Take 20 mg by mouth daily. Reported on 04/15/2015, Disp: , Rfl:   Review of Systems  Constitutional: Negative for appetite change, chills and fever.  Respiratory: Positive for shortness of breath and wheezing. Negative for chest tightness.   Cardiovascular: Negative for chest pain and palpitations.  Gastrointestinal: Negative for abdominal pain, nausea and vomiting.     Social History  Substance Use Topics  . Smoking status: Current Every Day Smoker    Packs/day: 2.00    Types: Cigarettes  . Smokeless tobacco: Never Used     Comment: Quit in January 2013, and started back in March.  . Alcohol use 0.0 oz/week     Comment: a couple of beers each week   Objective:   BP 130/70 (BP Location: Right Arm, Patient Position: Sitting, Cuff Size: Large)   Pulse 92   Temp 98.2 F (36.8 C) (Oral)   Resp 16   Ht  (1.778 m)   Wt 281 lb (127.5 kg)   SpO2 98%   BMI 40.32 kg/m  Vitals:   11/26/16 1557  BP: 130/70  Pulse: 92  Resp: 16  Temp: 98.2 F (36.8 C)  TempSrc: Oral  SpO2: 98%  Weight: 281 lb (127.5 kg)  Height:  (1.778 m)   Depression screen [facility] 2/9 11/26/2016  Decreased Interest 1  Down, Depressed, Hopeless 0  PHQ - 2 Score 1  Altered sleeping 3  Tired, decreased energy 1  Change in appetite 3  Feeling bad or failure about yourself  1  Trouble concentrating 0  Moving slowly or fidgety/restless 0  Suicidal thoughts 0  PHQ-9 Score 9  Difficult doing work/chores  Somewhat difficult     Physical Exam  General Appearance:    Alert, cooperative, no distress  HENT:   ENT exam normal, no neck nodes or sinus tenderness  Eyes:    PERRL, conjunctiva/corneas clear, EOM's intact       Lungs:     Clear to auscultation bilaterally, respirations unlabored, diffuse intermittent inspiratory and expiratory wheezes.   Heart:    Regular rate and rhythm  Neurologic:   Awake, alert, oriented x 3. No apparent focal neurological           defect.           Assessment & Plan:     1. Precordial pain Not classic angina, but he has multiple cardiac risk factores.  - Myocardial Perfusion Imaging; Future - DG Chest 2 View; Future  2. Anxiety, generalized Alprazolam remains effective. Discussed SSRIs and SNRI which has tried in the past and did not tolerate due to sexual side effects.   3. Hyperlipidemia, mixed .Counseled on importance of  taking statin to mitigate cardiac risk.  - atorvastatin (LIPITOR) 20 MG tablet; Take 1 tablet (20 mg total) by mouth daily. Reported on 04/15/2015  Dispense: 30 tablet; Refill: 12  4. Tobacco abuse Counseled on importance of smoking cessation due to adverse cardiac effects and lung disease.   5. Shortness of breath Multifactorial, combination of heavy smoking, obesity and poor general conditioning. Imperative to stop smoking.  - DG Chest 2 View; Future  6. Need for influenza vaccination  - Flu Vaccine QUAD 36+ mos IM       Mila Merryonald Anderia Lorenzo, MD  [facility]

## 2016-11-26 NOTE — Patient Instructions (Signed)
Coping with Quitting Smoking Quitting smoking is a physical and mental challenge. You will face cravings, withdrawal symptoms, and temptation. Before quitting, work with your health care provider to make a plan that can help you cope. Preparation can help you quit and keep you from giving in. How can I cope with cravings? Cravings usually last for 5-10 minutes. If you get through it, the craving will pass. Consider taking the following actions to help you cope with cravings:  Keep your mouth busy: ? Chew sugar-free gum. ? Suck on hard candies or a straw. ? Brush your teeth.  Keep your hands and body busy: ? Immediately change to a different activity when you feel a craving. ? Squeeze or play with a ball. ? Do an activity or a hobby, like making bead jewelry, practicing needlepoint, or working with wood. ? Mix up your normal routine. ? Take a short exercise break. Go for a quick walk or run up and down stairs. ? Spend time in public places where smoking is not allowed.  Focus on doing something kind or helpful for someone else.  Call a friend or family member to talk during a craving.  Join a support group.  Call a quit line, such as 1-800-QUIT-NOW.  Talk with your health care provider about medicines that might help you cope with cravings and make quitting easier for you.  How can I deal with withdrawal symptoms? Your body may experience negative effects as it tries to get used to not having nicotine in the system. These effects are called withdrawal symptoms. They may include:  Feeling hungrier than normal.  Trouble concentrating.  Irritability.  Trouble sleeping.  Feeling depressed.  Restlessness and agitation.  Craving a cigarette.  To manage withdrawal symptoms:  Avoid places, people, and activities that trigger your cravings.  Remember why you want to quit.  Get plenty of sleep.  Avoid coffee and other caffeinated drinks. These may worsen some of your  symptoms.  How can I handle social situations? Social situations can be difficult when you are quitting smoking, especially in the first few weeks. To manage this, you can:  Avoid parties, bars, and other social situations where people might be smoking.  Avoid alcohol.  Leave right away if you have the urge to smoke.  Explain to your family and friends that you are quitting smoking. Ask for understanding and support.  Plan activities with friends or family where smoking is not an option.  What are some ways I can cope with stress? Wanting to smoke may cause stress, and stress can make you want to smoke. Find ways to manage your stress. Relaxation techniques can help. For example:  Breathe slowly and deeply, in through your nose and out through your mouth.  Listen to soothing, relaxing music.  Talk with a family member or friend about your stress.  Light a candle.  Soak in a bath or take a shower.  Think about a peaceful place.  What are some ways I can prevent weight gain? Be aware that many people gain weight after they quit smoking. However, not everyone does. To keep from gaining weight, have a plan in place before you quit and stick to the plan after you quit. Your plan should include:  Having healthy snacks. When you have a craving, it may help to: ? Eat plain popcorn, crunchy carrots, celery, or other cut vegetables. ? Chew sugar-free gum.  Changing how you eat: ? Eat small portion sizes at meals. ?   Eat 4-6 small meals throughout the day instead of 1-2 large meals a day. ? Be mindful when you eat. Do not watch television or do other things that might distract you as you eat.  Exercising regularly: ? Make time to exercise each day. If you do not have time for a long workout, do short bouts of exercise for 5-10 minutes several times a day. ? Do some form of strengthening exercise, like weight lifting, and some form of aerobic exercise, like running or  swimming.  Drinking plenty of water or other low-calorie or no-calorie drinks. Drink 6-8 glasses of water daily, or as much as instructed by your health care provider.  Summary  Quitting smoking is a physical and mental challenge. You will face cravings, withdrawal symptoms, and temptation to smoke again. Preparation can help you as you go through these challenges.  You can cope with cravings by keeping your mouth busy (such as by chewing gum), keeping your body and hands busy, and making calls to family, friends, or a helpline for people who want to quit smoking.  You can cope with withdrawal symptoms by avoiding places where people smoke, avoiding drinks with caffeine, and getting plenty of rest.  Ask your health care provider about the different ways to prevent weight gain, avoid stress, and handle social situations. This information is not intended to replace advice given to you by your health care provider. Make sure you discuss any questions you have with your health care provider. Document Released: 03/05/2016 Document Revised: 03/05/2016 Document Reviewed: 03/05/2016 Elsevier Interactive Patient Education  2018 [employer]  

## 2016-11-30 ENCOUNTER — Telehealth: Payer: Self-pay | Admitting: Family Medicine

## 2016-11-30 DIAGNOSIS — R079 Chest pain, unspecified: Secondary | ICD-10-CM

## 2016-11-30 NOTE — Telephone Encounter (Signed)
Have re-entered order

## 2016-11-30 NOTE — Telephone Encounter (Signed)
[facility] or [facility] state they do not do the myocardial perfusion Imaging study that you put an order in for If you are trying to order a myoview stress test the order # is [id number]

## 2016-12-07 ENCOUNTER — Telehealth: Payer: Self-pay | Admitting: Family Medicine

## 2016-12-07 ENCOUNTER — Encounter: Admission: RE | Admit: 2016-12-07 | Payer: 59 | Source: Ambulatory Visit

## 2016-12-07 NOTE — Telephone Encounter (Signed)
Please contact patient and see if he can reschedule stress test

## 2016-12-07 NOTE — Telephone Encounter (Signed)
Addie with [facility] called and stated pt had a stress test scheduled for today but pt did not show up for the appointment/MW

## 2016-12-08 NOTE — Telephone Encounter (Signed)
lmtcb

## 2016-12-09 NOTE — Telephone Encounter (Signed)
Left message to call back  

## 2016-12-09 NOTE — Telephone Encounter (Signed)
Patient called back and was advised as below. Patient was unable to take the number down to reschedule the appointment because he was driving. Patient states he would call back for the number once he gets home.

## 2016-12-20 ENCOUNTER — Other Ambulatory Visit: Payer: Self-pay | Admitting: Family Medicine

## 2016-12-21 NOTE — Telephone Encounter (Signed)
Please call in alprazolam.  

## 2016-12-21 NOTE — Telephone Encounter (Signed)
Called in medication as below.  

## 2017-01-21 ENCOUNTER — Telehealth: Payer: Self-pay | Admitting: Family Medicine

## 2017-01-21 NOTE — Telephone Encounter (Signed)
Pt did not get Nuc Med test that was ordered in Sept

## 2017-01-21 NOTE — Telephone Encounter (Signed)
fyi-Anastasiya V Hopkins, RMA  

## 2017-02-15 ENCOUNTER — Other Ambulatory Visit: Payer: Self-pay | Admitting: Family Medicine

## 2017-02-24 NOTE — Progress Notes (Deleted)
       Patient: Todd Chen Male    DOB: 04/22/1975   41 y.o.   MRN: [id number] Visit Date: 02/24/2017  Today's Provider: Mila Merryonald [name], MD   No chief complaint on file.  Subjective:    HPI    Lipid/Cholesterol, Follow-up:   Last seen for this 3 months ago.  Management since that visit include; no changes. Counseled on the importance of taking statin medication to mitigate cardiac risk.  Last Lipid Panel:    Component Value Date/Time   CHOL 245 (H) 11/24/2016 1530   TRIG 815 (HH) 11/24/2016 1530   HDL 29 (L) 11/24/2016 1530   CHOLHDL 8.4 (H) 11/24/2016 1530   LDLCALC Comment 11/24/2016 1530    He reports {excellent/good/fair/poor:19665} compliance with treatment. He {ACTION; IS/IS [id number]} having side effects. ***  Wt Readings from Last 3 Encounters:  11/26/16 281 lb (127.5 kg)  10/11/16 278 lb (126.1 kg)  05/22/15 259 lb (117.5 kg)    ------------------------------------------------------------------------  Precordial pain From 11/26/2016-Myocardial Perfusion test ordered but not done. Chest x-ray done showing normal.  Anxiety, generalized From 11/26/2016-no changes.  Tobacco abuse From 11/26/2016-Counseled on importance of smoking cessation due to adverse cardiac effects and lung disease.     Allergies  Allergen Reactions  . Chantix  [Varenicline]     Crazy Dreams  . Wellbutrin Sr  [Bupropion Hcl]     Stomach pains     Current Outpatient Medications:  .  ALPRAZolam (XANAX) 1 MG tablet, TAKE 1-2 TABLETS BY MOUTH DAILY AS NEEDED, Disp: 30 tablet, Rfl: 5 .  atorvastatin (LIPITOR) 20 MG tablet, Take 1 tablet (20 mg total) by mouth daily. Reported on 04/15/2015, Disp: 30 tablet, Rfl: 12 .  Melatonin 10 MG TABS, Take by mouth., Disp: , Rfl:  .  Mometasone Furoate (ASMANEX HFA) 100 MCG/ACT AERO, Inhale 2 puffs into the lungs 2 (two) times daily., Disp: 1 Inhaler, Rfl: 5 .  sucralfate (CARAFATE) 1 g tablet, Take 1 g by mouth 4 (four) times daily as  needed., Disp: , Rfl:  .  valACYclovir (VALTREX) 1000 MG tablet, TAKE 2 TABLETS BY MOUTH TWICE DAILY AS NEEDED AS DIRECTED BY PHYSICIAN., Disp: 30 tablet, Rfl: 12 .  VENTOLIN HFA 108 (90 Base) MCG/ACT inhaler, USE TWO PUFFS EVERY SIX HOURS AS NEEDED, Disp: 18 g, Rfl: 4  Review of Systems  Constitutional: Negative for appetite change, chills and fever.  Respiratory: Negative for chest tightness, shortness of breath and wheezing.   Cardiovascular: Negative for chest pain and palpitations.  Gastrointestinal: Negative for abdominal pain, nausea and vomiting.    Social History   Tobacco Use  . Smoking status: Current Every Day Smoker    Packs/day: 2.00    Types: Cigarettes  . Smokeless tobacco: Never Used  . Tobacco comment: Quit in January 2013, and started back in March.  Substance Use Topics  . Alcohol use: Yes    Alcohol/week: 0.0 oz    Comment: a couple of beers each week   Objective:   There were no vitals taken for this visit. There were no vitals filed for this visit.   Physical Exam      Assessment & Plan:           Mila Merryonald [name], MD  [facility]

## 2017-02-25 ENCOUNTER — Ambulatory Visit: Payer: Self-pay | Admitting: Family Medicine

## 2017-04-06 DIAGNOSIS — H52223 Regular astigmatism, bilateral: Secondary | ICD-10-CM | POA: Diagnosis not present

## 2017-05-01 ENCOUNTER — Encounter: Payer: Self-pay | Admitting: Family Medicine

## 2017-05-02 ENCOUNTER — Other Ambulatory Visit: Payer: Self-pay | Admitting: *Deleted

## 2017-05-04 MED ORDER — ALBUTEROL SULFATE HFA 108 (90 BASE) MCG/ACT IN AERS: INHALATION_SPRAY | RESPIRATORY_TRACT | 2 refills | Status: DC

## 2017-05-04 MED ORDER — ALPRAZOLAM 1 MG PO TABS: ORAL_TABLET | ORAL | 0 refills | Status: DC

## 2017-05-11 ENCOUNTER — Ambulatory Visit (INDEPENDENT_AMBULATORY_CARE_PROVIDER_SITE_OTHER): Payer: 59 | Admitting: Family Medicine

## 2017-05-11 ENCOUNTER — Telehealth: Payer: Self-pay | Admitting: *Deleted

## 2017-05-11 ENCOUNTER — Encounter: Payer: Self-pay | Admitting: Family Medicine

## 2017-05-11 ENCOUNTER — Other Ambulatory Visit: Payer: Self-pay | Admitting: Family Medicine

## 2017-05-11 VITALS — BP 158/98 | Temp 98.2°F | Wt 273.0 lb

## 2017-05-11 DIAGNOSIS — F411 Generalized anxiety disorder: Secondary | ICD-10-CM | POA: Diagnosis not present

## 2017-05-11 DIAGNOSIS — E782 Mixed hyperlipidemia: Secondary | ICD-10-CM | POA: Diagnosis not present

## 2017-05-11 DIAGNOSIS — I10 Essential (primary) hypertension: Secondary | ICD-10-CM | POA: Insufficient documentation

## 2017-05-11 DIAGNOSIS — R030 Elevated blood-pressure reading, without diagnosis of hypertension: Secondary | ICD-10-CM | POA: Diagnosis not present

## 2017-05-11 MED ORDER — ALPRAZOLAM 1 MG PO TABS: 1.0000 mg | ORAL_TABLET | Freq: Two times a day (BID) | ORAL | 3 refills | Status: DC | PRN

## 2017-05-11 MED ORDER — METOPROLOL SUCCINATE ER 25 MG PO TB24: 25.0000 mg | ORAL_TABLET | Freq: Every day | ORAL | 1 refills | Status: DC

## 2017-05-11 NOTE — Progress Notes (Signed)
Patient: Todd Chen Male    DOB: 03/07/1976   42 y.o.   MRN: [id number] Visit Date: 05/11/2017  Today's Provider: Mila Merryonald Keishawna Carranza, MD   Chief Complaint  Patient presents with  . Hypertension   Subjective:    HPI Patient presents ttoday c/o elevated BP. He reports that he has averaged in 150s/100s. He denies any chest pain. He is having headaches, but reports that this comes and goes. He reports that his BP has been running high since yesterday. 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 He finds alprazolam is helping, but stress seems to be getting more persistent as time goes by. He is currently taking 1/2-1 tablet twice a day just about every day. He has stopped drinking alcohol.     Wt Readings from Last 5 Encounters:  05/11/17 273 lb (123.8 kg)  11/26/16 281 lb (127.5 kg)  10/11/16 278 lb (126.1 kg)  05/22/15 259 lb (117.5 kg)  04/15/15 268 lb (121.6 kg)    Lipid/Cholesterol, Follow-up:   Last seen for this5 months ago.  Management changes since that visit include starting back on atorvastatin which he had no been taking consistently. . . Last Lipid Panel:    Component Value Date/Time   CHOL 245 (H) 11/24/2016 1530   TRIG 815 (HH) 11/24/2016 1530   HDL 29 (L) 11/24/2016 1530   CHOLHDL 8.4 (H) 11/24/2016 1530   LDLCALC Comment 11/24/2016 1530     He reports good compliance with treatment. He is not having side effects. Although he states he has been taking in the morning because it keeps him up when he takes it at nigh.  Weight trend: stable  Current exercise: none  Wt Readings from Last 3 Encounters:  05/11/17 273 lb (123.8 kg)  11/26/16 281 lb (127.5 kg)  10/11/16 278 lb (126.1 kg)    -------------------------------------------------------------------     Allergies  Allergen Reactions  . Chantix  [Varenicline]     Crazy Dreams  . Wellbutrin Sr  [Bupropion Hcl]     Stomach pains     Current  Outpatient Medications:  .  albuterol (VENTOLIN HFA) 108 (90 Base) MCG/ACT inhaler, INHALE 2 PUFFS BY MOUTH EVERY 6 HOURS AS NEEDED, Disp: 18 g, Rfl: 2 .  ALPRAZolam (XANAX) 1 MG tablet, TAKE 1-2 TABLETS BY MOUTH DAILY AS NEEDED, Disp: 30 tablet, Rfl: 0 .  atorvastatin (LIPITOR) 20 MG tablet, Take 1 tablet (20 mg total) by mouth daily. Reported on 04/15/2015, Disp: 30 tablet, Rfl: 12 .  Melatonin 10 MG TABS, Take by mouth., Disp: , Rfl:  .  Mometasone Furoate (ASMANEX HFA) 100 MCG/ACT AERO, Inhale 2 puffs into the lungs 2 (two) times daily., Disp: 1 Inhaler, Rfl: 5 .  sucralfate (CARAFATE) 1 g tablet, Take 1 g by mouth 4 (four) times daily as needed., Disp: , Rfl:  .  valACYclovir (VALTREX) 1000 MG tablet, TAKE 2 TABLETS BY MOUTH TWICE DAILY AS NEEDED AS DIRECTED BY PHYSICIAN., Disp: 30 tablet, Rfl: 12  Review of Systems  Constitutional: Negative.   Respiratory: Negative for apnea and chest tightness.   Cardiovascular: Negative for chest pain, palpitations and leg swelling.  Psychiatric/Behavioral: The patient is nervous/anxious.     Social History   Tobacco Use  . Smoking status: Current Every Day Smoker    Packs/day: 2.00    Types: Cigarettes  . Smokeless tobacco: Never Used  . Tobacco comment: Quit in January 2013, and started back  in March.  Substance Use Topics  . Alcohol use: Yes    Alcohol/week: 0.0 oz    Comment: a couple of beers each week   Objective:   BP (!) 158/98 (BP Location: Right Arm, Patient Position: Sitting, Cuff Size: Large)   Temp 98.2 F (36.8 C)   Wt 273 lb (123.8 kg)   BMI 39.17 kg/m  Vitals:   05/11/17 1551  BP: (!) 158/98  Temp: 98.2 F (36.8 C)  Weight: 273 lb (123.8 kg)     Physical Exam  General Appearance:    Alert, cooperative, no distress, obese, verbose, anxious  Eyes:    PERRL, conjunctiva/corneas clear, EOM's intact       Lungs:     Clear to auscultation bilaterally, respirations unlabored  Heart:    Regular rate and rhythm    Neurologic:   Awake, alert, oriented x 3. No apparent focal neurological           defect.           Assessment & Plan:     1. Hyperlipidemia, mixed He states he is now taking atorvastatin every day consistently.  - Lipid panel - Comprehensive metabolic panel  2. Anxiety, generalized Stable taking up to 2 alprazolam daily for several years. Will consider buspirone if he feels need for more medication.  - ALPRAZolam (XANAX) 1 MG tablet; Take 1 tablet (1 mg total) by mouth 2 (two) times daily as needed for anxiety.  Dispense: 50 tablet; Refill: 3  3. Elevated blood pressure reading Likely due to anxiety, but he is concerned and would like to take medication to keep BP under control. Consider anxiety level he would likely due well with betablocker.  - metoprolol succinate (TOPROL-XL) 25 MG 24 hr tablet; Take 1 tablet (25 mg total) by mouth daily.  Dispense: 30 tablet; Refill: 1  Return 1 month for medication and BP check.        Mila Merry, MD  [facility]

## 2017-05-11 NOTE — Telephone Encounter (Signed)
Patient called office stating that [facility] and it was running high at 166/102. Patent states he bought a blood pressure machine yesterday after the high reading. Patient has checked his bp several times since and he keeps getting high readings. Patient's last bp reading was 146/105. Patient states he has had a headache since yesterday but no other symptoms. Patient scheduled an appt today at 3:45 pm.

## 2017-05-12 ENCOUNTER — Other Ambulatory Visit: Payer: Self-pay | Admitting: Family Medicine

## 2017-05-12 DIAGNOSIS — E782 Mixed hyperlipidemia: Secondary | ICD-10-CM

## 2017-05-12 LAB — LIPID PANEL
CHOL/HDL RATIO: 7.9 ratio — AB (ref 0.0–5.0)
Cholesterol, Total: 237 mg/dL — ABNORMAL HIGH (ref 100–199)
HDL: 30 mg/dL — ABNORMAL LOW (ref >39)
Triglycerides: 646 mg/dL (ref 0–149)

## 2017-05-12 LAB — COMPREHENSIVE METABOLIC PANEL
ALK PHOS: 75 IU/L (ref 39–117)
ALT: 29 IU/L (ref 0–44)
AST: 22 IU/L (ref 0–40)
Albumin/Globulin Ratio: 1.9 (ref 1.2–2.2)
Albumin: 4.8 g/dL (ref 3.5–5.5)
BILIRUBIN TOTAL: 0.3 mg/dL (ref 0.0–1.2)
BUN/Creatinine Ratio: 13 (ref 9–20)
BUN: 12 mg/dL (ref 6–20)
CHLORIDE: 104 mmol/L (ref 96–106)
CO2: 21 mmol/L (ref 20–29)
Calcium: 9.8 mg/dL (ref 8.7–10.2)
Creatinine, Ser: 0.95 mg/dL (ref 0.76–1.27)
GFR calc Af Amer: 116 mL/min/{1.73_m2} (ref >59)
GFR calc non Af Amer: 100 mL/min/{1.73_m2} (ref >59)
GLUCOSE: 100 mg/dL — AB (ref 65–99)
Globulin, Total: 2.5 g/dL (ref 1.5–4.5)
Potassium: 4.6 mmol/L (ref 3.5–5.2)
SODIUM: 140 mmol/L (ref 134–144)
Total Protein: 7.3 g/dL (ref 6.0–8.5)

## 2017-05-12 MED ORDER — ATORVASTATIN CALCIUM 40 MG PO TABS: 40.0000 mg | ORAL_TABLET | Freq: Every day | ORAL | 3 refills | Status: DC

## 2017-06-08 ENCOUNTER — Ambulatory Visit: Payer: Self-pay | Admitting: Family Medicine

## 2017-06-08 NOTE — Progress Notes (Deleted)
       Patient: Todd Chen Male    DOB: 10/11/1975   42 y.o.   MRN: [id number] Visit Date: 06/08/2017  Today's Provider: Mila Merryonald [name], MD   No chief complaint on file.  Subjective:    HPI   Hypertension, follow-up:  BP Readings from Last 3 Encounters:  05/11/17 (!) 158/98  11/26/16 130/70  10/11/16 140/88    He was last seen for hypertension 1 months ago.  BP at that visit was 158/98. Management since that visit includes; likely due to anxiety but patient wanted to start a medication. Started metoprolol succinate 25 mg qd.He reports {excellent/good/fair/poor:19665} compliance with treatment. He {ACTION; IS/IS [id number]} having side effects. *** He {is/is not:9024} exercising. He {is/is not:9024} adherent to low salt diet.   Outside blood pressures are ***. He is experiencing {Symptoms; cardiac:12860}.  Patient denies {Symptoms; cardiac:12860}.   Cardiovascular risk factors include {cv risk factors:510}.  Use of agents associated with hypertension: {bp agents assoc with hypertension:511::"none"}.   ------------------------------------------------------------------------    Allergies  Allergen Reactions  . Chantix  [Varenicline]     Crazy Dreams  . Wellbutrin Sr  [Bupropion Hcl]     Stomach pains     Current Outpatient Medications:  .  albuterol (VENTOLIN HFA) 108 (90 Base) MCG/ACT inhaler, INHALE 2 PUFFS BY MOUTH EVERY 6 HOURS AS NEEDED, Disp: 18 g, Rfl: 2 .  ALPRAZolam (XANAX) 1 MG tablet, Take 1 tablet (1 mg total) by mouth 2 (two) times daily as needed for anxiety., Disp: 50 tablet, Rfl: 3 .  atorvastatin (LIPITOR) 40 MG tablet, Take 1 tablet (40 mg total) by mouth daily. Reported on 04/15/2015, Disp: 30 tablet, Rfl: 3 .  Melatonin 10 MG TABS, Take by mouth., Disp: , Rfl:  .  metoprolol succinate (TOPROL-XL) 25 MG 24 hr tablet, Take 1 tablet (25 mg total) by mouth daily., Disp: 30 tablet, Rfl: 1 .  Mometasone Furoate (ASMANEX HFA) 100 MCG/ACT AERO,  Inhale 2 puffs into the lungs 2 (two) times daily., Disp: 1 Inhaler, Rfl: 5 .  sucralfate (CARAFATE) 1 g tablet, Take 1 g by mouth 4 (four) times daily as needed., Disp: , Rfl:  .  valACYclovir (VALTREX) 1000 MG tablet, TAKE 2 TABLETS BY MOUTH TWICE DAILY AS NEEDED AS DIRECTED BY PHYSICIAN., Disp: 30 tablet, Rfl: 12  Review of Systems  Constitutional: Negative for appetite change, chills and fever.  Respiratory: Negative for chest tightness, shortness of breath and wheezing.   Cardiovascular: Negative for chest pain and palpitations.  Gastrointestinal: Negative for abdominal pain, nausea and vomiting.    Social History   Tobacco Use  . Smoking status: Current Every Day Smoker    Packs/day: 2.00    Types: Cigarettes  . Smokeless tobacco: Never Used  . Tobacco comment: Quit in January 2013, and started back in March.  Substance Use Topics  . Alcohol use: Yes    Alcohol/week: 0.0 oz    Comment: a couple of beers each week   Objective:   There were no vitals taken for this visit. There were no vitals filed for this visit.   Physical Exam      Assessment & Plan:           Mila Merryonald [name], MD  [facility]

## 2017-06-21 ENCOUNTER — Other Ambulatory Visit: Payer: Self-pay | Admitting: Family Medicine

## 2017-06-21 ENCOUNTER — Telehealth: Payer: Self-pay

## 2017-06-21 DIAGNOSIS — E782 Mixed hyperlipidemia: Secondary | ICD-10-CM

## 2017-06-21 DIAGNOSIS — F411 Generalized anxiety disorder: Secondary | ICD-10-CM

## 2017-06-21 NOTE — Telephone Encounter (Signed)
Patient requesting Xanax prescription to be changed to 1 tablet 2-3 times daily as needed.  Patient also requesting prescription refill for Valtrex. Patient also requesting lab order to recheck lipids. CB # [phone] [id number] patient refused to reschedule missed appt on 06/08/17.

## 2017-06-21 NOTE — Telephone Encounter (Signed)
Please advise 

## 2017-06-22 MED ORDER — ALPRAZOLAM 1 MG PO TABS: ORAL_TABLET | ORAL | 3 refills | Status: DC

## 2017-06-22 MED ORDER — VALACYCLOVIR HCL 1 G PO TABS: ORAL_TABLET | ORAL | 12 refills | Status: DC

## 2017-06-27 ENCOUNTER — Emergency Department: Payer: 59

## 2017-06-27 ENCOUNTER — Telehealth: Payer: Self-pay

## 2017-06-27 ENCOUNTER — Other Ambulatory Visit: Payer: Self-pay

## 2017-06-27 ENCOUNTER — Encounter: Payer: Self-pay | Admitting: Emergency Medicine

## 2017-06-27 ENCOUNTER — Emergency Department
Admission: EM | Admit: 2017-06-27 | Discharge: 2017-06-27 | Disposition: A | Discharge status: Home / Self Care | Payer: 59 | Attending: Emergency Medicine | Admitting: Emergency Medicine

## 2017-06-27 DIAGNOSIS — F419 Anxiety disorder, unspecified: Secondary | ICD-10-CM | POA: Insufficient documentation

## 2017-06-27 DIAGNOSIS — I82402 Acute embolism and thrombosis of unspecified deep veins of left lower extremity: Secondary | ICD-10-CM | POA: Diagnosis not present

## 2017-06-27 DIAGNOSIS — E785 Hyperlipidemia, unspecified: Secondary | ICD-10-CM | POA: Diagnosis not present

## 2017-06-27 DIAGNOSIS — Z79899 Other long term (current) drug therapy: Secondary | ICD-10-CM | POA: Diagnosis not present

## 2017-06-27 DIAGNOSIS — I82432 Acute embolism and thrombosis of left popliteal vein: Secondary | ICD-10-CM | POA: Diagnosis not present

## 2017-06-27 DIAGNOSIS — Z9049 Acquired absence of other specified parts of digestive tract: Secondary | ICD-10-CM | POA: Diagnosis not present

## 2017-06-27 DIAGNOSIS — F17210 Nicotine dependence, cigarettes, uncomplicated: Secondary | ICD-10-CM | POA: Insufficient documentation

## 2017-06-27 DIAGNOSIS — R2242 Localized swelling, mass and lump, left lower limb: Secondary | ICD-10-CM | POA: Diagnosis present

## 2017-06-27 LAB — COMPREHENSIVE METABOLIC PANEL
ALK PHOS: 65 U/L (ref 38–126)
ALT: 24 U/L (ref 17–63)
ANION GAP: 8 (ref 5–15)
AST: 21 U/L (ref 15–41)
Albumin: 4.4 g/dL (ref 3.5–5.0)
BUN: 15 mg/dL (ref 6–20)
CO2: 25 mmol/L (ref 22–32)
Calcium: 9.1 mg/dL (ref 8.9–10.3)
Chloride: 103 mmol/L (ref 101–111)
Creatinine, Ser: 0.85 mg/dL (ref 0.61–1.24)
GFR calc non Af Amer: >60 mL/min (ref >60)
Glucose, Bld: 96 mg/dL (ref 65–99)
POTASSIUM: 4.1 mmol/L (ref 3.5–5.1)
SODIUM: 136 mmol/L (ref 135–145)
TOTAL PROTEIN: 7.4 g/dL (ref 6.5–8.1)
Total Bilirubin: 0.6 mg/dL (ref 0.3–1.2)

## 2017-06-27 LAB — CBC WITH DIFFERENTIAL/PLATELET
BASOS ABS: 0.1 10*3/uL (ref 0–0.1)
Basophils Relative: 1 %
Eosinophils Absolute: 0.2 10*3/uL (ref 0–0.7)
Eosinophils Relative: 2 %
HCT: 44.7 % (ref 40.0–52.0)
HEMOGLOBIN: 15.5 g/dL (ref 13.0–18.0)
LYMPHS PCT: 22 %
Lymphs Abs: 3.1 10*3/uL (ref 1.0–3.6)
MCH: 32.7 pg (ref 26.0–34.0)
MCHC: 34.8 g/dL (ref 32.0–36.0)
MCV: 94.2 fL (ref 80.0–100.0)
Monocytes Absolute: 0.6 10*3/uL (ref 0.2–1.0)
Monocytes Relative: 4 %
NEUTROS ABS: 10.1 10*3/uL — AB (ref 1.4–6.5)
NEUTROS PCT: 71 %
Platelets: 221 10*3/uL (ref 150–440)
RBC: 4.75 MIL/uL (ref 4.40–5.90)
RDW: 12.6 % (ref 11.5–14.5)
WBC: 14.1 10*3/uL — AB (ref 3.8–10.6)

## 2017-06-27 LAB — FIBRIN DERIVATIVES D-DIMER (ARMC ONLY): Fibrin derivatives D-dimer (ARMC): 1749.74 ng/mL (FEU) — ABNORMAL HIGH (ref 0.00–499.00)

## 2017-06-27 LAB — PROTIME-INR
INR: 1
Prothrombin Time: 13.1 seconds (ref 11.4–15.2)

## 2017-06-27 LAB — APTT: aPTT: 28 seconds (ref 24–36)

## 2017-06-27 MED ORDER — APIXABAN 5 MG PO TABS: 5.0000 mg | ORAL_TABLET | Freq: Two times a day (BID) | ORAL | 0 refills | Status: DC

## 2017-06-27 MED ORDER — APIXABAN 5 MG PO TABS
5.0000 mg | ORAL_TABLET | Freq: Two times a day (BID) | ORAL | Status: DC
  Administered 2017-06-27: 5 mg via ORAL

## 2017-06-27 MED ORDER — APIXABAN 5 MG PO TABS
ORAL_TABLET | ORAL | Status: AC
  Administered 2017-06-27: 5 mg via ORAL
  Filled 2017-06-27: qty 1

## 2017-06-27 NOTE — ED Notes (Signed)
Back from u/s  Family at bedside  Awaiting results

## 2017-06-27 NOTE — ED Triage Notes (Signed)
Presents with left lower swelling today  Had had pain to leg for about 2 weeks

## 2017-06-27 NOTE — ED Provider Notes (Signed)
[facility] Emergency Department Provider Note   ____________________________________________   First MD Initiated Contact with Patient 06/27/17 1558     (approximate)  I have reviewed the triage vital signs and the nursing notes.   HISTORY  Chief Complaint Leg Swelling    HPI Todd Chen is a 42 y.o. male patient sent over by PCP for ultrasound to rule out DVT.  Patient has left leg pain and swelling for about 2 weeks.  Patient also complained mild episodes of dyspnea.  Patient denies chest pain.  Patient rates pain as a 3/10.  Patient described the pain is "aching".  Patient 1 pack day smoker. Past Medical History:  Diagnosis Date  . Chronic back pain   . Herpes labialis   . PUD (peptic ulcer disease)   . Scrotal cyst     Patient Active Problem List   Diagnosis Date Noted  . Elevated blood pressure reading 05/11/2017  . Tobacco abuse 10/11/2016  . Backache 08/28/2014  . Anxiety, generalized 08/28/2014  . Acid reflux 08/28/2014  . Cold sore 08/28/2014  . Hyperlipidemia, mixed 08/28/2014  . Insomnia 08/28/2014  . Episodic mood disorder (HCC) 08/28/2014  . Obesity 08/28/2014  . Sebaceous cyst 08/28/2014    Past Surgical History:  Procedure Laterality Date  . CHOLECYSTECTOMY  2002    Prior to Admission medications   Medication Sig Start Date End Date Taking? Authorizing Provider  albuterol (VENTOLIN HFA) 108 (90 Base) MCG/ACT inhaler INHALE 2 PUFFS BY MOUTH EVERY 6 HOURS AS NEEDED 05/11/17   [name], MD  ALPRAZolam Prudy Feeler) 1 MG tablet One tablet 2 two to 3 times daily as needed 06/22/17   [name], MD  apixaban (ELIQUIS) 5 MG TABS tablet Take 1 tablet (5 mg total) by mouth 2 (two) times daily. 06/27/17   Joni Reining, PA-C  atorvastatin (LIPITOR) 40 MG tablet Take 1 tablet (40 mg total) by mouth daily. Reported on 04/15/2015 05/12/17   [name], MD  Melatonin 10 MG TABS Take by mouth.    [provider]    metoprolol succinate (TOPROL-XL) 25 MG 24 hr tablet Take 1 tablet (25 mg total) by mouth daily. 05/11/17   [name], MD  Mometasone Furoate [facility] HFA) 100 MCG/ACT AERO Inhale 2 puffs into the lungs 2 (two) times daily. 11/26/16   [name], MD  sucralfate (CARAFATE) 1 g tablet Take 1 g by mouth 4 (four) times daily as needed.    [provider]  valACYclovir (VALTREX) 1000 MG tablet TAKE 2 TABLETS BY MOUTH TWICE DAILY AS NEEDED AS DIRECTED BY PHYSICIAN. 06/22/17   [name], MD    Allergies Chantix  [varenicline] and Wellbutrin sr  [bupropion hcl]  Family History  Problem Relation Age of Onset  . Emphysema Mother   . Hypertension Father   . Diabetes Father   . Hyperlipidemia Father     Social History Social History   Tobacco Use  . Smoking status: Current Every Day Smoker    Packs/day: 2.00    Types: Cigarettes  . Smokeless tobacco: Never Used  . Tobacco comment: Quit in January 2013, and started back in March.  Substance Use Topics  . Alcohol use: Yes    Alcohol/week: 0.0 oz    Comment: a couple of beers each week  . Drug use: Not on file    Review of Systems Constitutional: No fever/chills Eyes: No visual changes. ENT: No sore throat. Cardiovascular:  Denies chest pain. Respiratory: Denies shortness of breath. Gastrointestinal: No abdominal pain.  No nausea, no vomiting.  No diarrhea.  No constipation. Genitourinary: Negative for dysuria. Musculoskeletal: Left lower leg edema and pain. Skin: Negative for rash. Neurological: Negative for headaches, focal weakness or numbness. Psychiatric:Anxiety and insomnia. Endocrine:Hyperlipidemia. Allergic/Immunilogical: Chantix and Wellbutrin ____________________________________________   PHYSICAL EXAM:  VITAL SIGNS: ED Triage Vitals [06/27/17 1558]  Enc Vitals Group     BP      Pulse      Resp      Temp      Temp src      SpO2      Weight 273 lb (123.8 kg)     Height 5\' 7"  (1.702 m)      Head Circumference      Peak Flow      Pain Score      Pain Loc      Pain Edu?      Excl. in GC?    Constitutional: Alert and oriented. Well appearing and in no acute distress. Neck: No stridor.   Cardiovascular: Normal rate, regular rhythm. Grossly normal heart sounds.  Good peripheral circulation. Respiratory: Normal respiratory effort.  No retractions. Lungs CTAB. Gastrointestinal: Soft and nontender. No distention. No abdominal bruits. No CVA tenderness. Musculoskeletal: Left lower calf edema measuring 41 cm circumference versus 39cm of the right calf.  Moderate guarding with palpation posterior calf. Neurologic:  Normal speech and language. No gross focal neurologic deficits are appreciated. No gait instability. Skin:  Skin is warm, dry and intact. No rash noted. Psychiatric: Mood and affect are normal. Speech and behavior are normal.  ____________________________________________   LABS (all labs ordered are listed, but only abnormal results are displayed)  Labs Reviewed  CBC WITH DIFFERENTIAL/PLATELET - Abnormal; Notable for the following components:      Result Value   WBC 14.1 (*)    Neutro Abs 10.1 (*)    All other components within normal limits  PROTIME-INR  APTT  COMPREHENSIVE METABOLIC PANEL  FIBRIN DERIVATIVES D-DIMER ([facility] ONLY)   ____________________________________________  EKG   ____________________________________________  RADIOLOGY  ED MD interpretation:    Official radiology report(s): US Venous Img Lower Unilateral Left  Result Date: 06/27/2017 CLINICAL DATA:  Left calf pain and swelling. EXAM: LEFT LOWER EXTREMITY VENOUS DOPPLER ULTRASOUND TECHNIQUE: Gray-scale sonography with graded compression, as well as color Doppler and duplex ultrasound were performed to evaluate the lower extremity deep venous systems from the level of the common femoral vein and including the common femoral, femoral, profunda femoral, popliteal and calf veins including  the posterior tibial, peroneal and gastrocnemius veins when visible. The superficial great saphenous vein was also interrogated. Spectral Doppler was utilized to evaluate flow at rest and with distal augmentation maneuvers in the common femoral, femoral and popliteal veins. COMPARISON:  None. FINDINGS: Contralateral Common Femoral Vein: No thrombus. Normal compressibility, phasicity and color Doppler flow. Common Femoral Vein: No evidence of thrombus. Normal compressibility, respiratory phasicity and response to augmentation. Saphenofemoral Junction: No evidence of thrombus. Normal compressibility and flow on color Doppler imaging. Profunda Femoral Vein: No evidence of thrombus. Normal compressibility and flow on color Doppler imaging. Femoral Vein: No evidence of thrombus. Normal compressibility, respiratory phasicity and response to augmentation. Popliteal Vein: Incomplete compressibility of the left popliteal vein. Evidence for nonocclusive thrombus within the left popliteal vein. Calf Veins: Positive for thrombus within gastrocnemius veins. Gastrocnemius clot appears to be occlusive. Visualized peroneal and posterior tibial veins are patent without  thrombus. Other Findings:  None. IMPRESSION: Positive for deep venous thrombosis in the left lower extremity. Occlusive thrombus in the left gastrocnemius veins. Nonocclusive thrombus in the left popliteal vein. Electronically Signed   By: Richarda OverlieAdam  Henn M.D.   On: 06/27/2017 17:32    ____________________________________________   PROCEDURES  Procedure(s) performed: None  Procedures  Critical Care performed: No  ____________________________________________   INITIAL IMPRESSION / ASSESSMENT AND PLAN / ED COURSE  As part of my medical decision making, I reviewed the following data within the electronic MEDICAL RECORD NUMBER    Left lower calf edema and pain.  Differential consist of muscle spasm, DVT or peripheral edema.  Discussed ultrasound findings  consistent with an occlusive thrombosis of the left gastrocnemius vein. There is also a nonocclusive thrombus in the left popliteal vein of the left leg.  Discussed patient with on-call vascular surgeon.  Patient will be discharged with prescription for Eliquis and advised to contact vascular surgical department tomorrow morning.     ____________________________________________   FINAL CLINICAL IMPRESSION(S) / ED DIAGNOSES  Final diagnoses:  Acute deep vein thrombosis (DVT) of left lower extremity, unspecified vein [facility](HCC)     ED Discharge Orders        Ordered    apixaban (ELIQUIS) 5 MG TABS tablet  2 times daily     06/27/17 1842       Note:  This document was prepared using Dragon voice recognition software and may include unintentional dictation errors.    Joni ReiningSmith, Mailynn Everly K, PA-C 06/27/17 1847    Minna AntisPaduchowski, Kevin, MD 06/28/17 [phone]

## 2017-06-27 NOTE — Telephone Encounter (Signed)
Patient called office with complaint of pain in his right calf muscle, patient states that it has been for a week and he has been applying icy hot to affected area. Patient denied injury, or activity/exertion that could have triggered pain. Patient states that it is now radiating down to his ankle and has become more painful. He reports warmth to the touch, redness and swelling. Patient had concerns that this could possibly be a blockage after speaking with his wife who patient reports works in ER. After reviewing triage call with Dr. Sherrie MustacheFisher it was determined that patient should be evaluated in ED, patient verbalized understanding. KW

## 2017-06-28 ENCOUNTER — Ambulatory Visit (INDEPENDENT_AMBULATORY_CARE_PROVIDER_SITE_OTHER): Payer: 59 | Admitting: Vascular Surgery

## 2017-06-28 VITALS — BP 124/88 | HR 88 | Resp 18 | Ht 68.0 in | Wt 281.0 lb

## 2017-06-28 DIAGNOSIS — Z720 Tobacco use: Secondary | ICD-10-CM

## 2017-06-28 DIAGNOSIS — I82432 Acute embolism and thrombosis of left popliteal vein: Secondary | ICD-10-CM | POA: Diagnosis not present

## 2017-06-28 DIAGNOSIS — E782 Mixed hyperlipidemia: Secondary | ICD-10-CM

## 2017-06-28 DIAGNOSIS — I82409 Acute embolism and thrombosis of unspecified deep veins of unspecified lower extremity: Secondary | ICD-10-CM | POA: Insufficient documentation

## 2017-06-28 DIAGNOSIS — Z86718 Personal history of other venous thrombosis and embolism: Secondary | ICD-10-CM | POA: Insufficient documentation

## 2017-06-28 NOTE — Progress Notes (Signed)
Patient ID: Todd Chen, male   DOB: [birth year], 42 y.o.   MRN: [id number]  Chief Complaint  Patient presents with  . New Patient (Initial Visit)    DVT    HPI Todd Chen is a 42 y.o. male.  I am asked to see the patient by Dr. Cherylann Banas in the [facility] ER for evaluation of DVT.  The patient reports about a week of pain and swelling in the left leg.  This was originally thought to be musculoskeletal, but when it did not get better and swelling began, he was then evaluated for DVT.  He went to the ER and a duplex shows left popliteal and tibial vein DVT.  He does have a previous history of leg trauma on two different occasions in years past.  He flies an airplane.  He has not had any recent surgery.     Past Medical History:  Diagnosis Date  . Chronic back pain   . Herpes labialis   . PUD (peptic ulcer disease)   . Scrotal cyst     Past Surgical History:  Procedure Laterality Date  . CHOLECYSTECTOMY  2002    Family History  Problem Relation Age of Onset  . Emphysema Mother   . Hypertension Father   . Diabetes Father   . Hyperlipidemia Father   No bleeding or clotting disorders  Social History Social History   Tobacco Use  . Smoking status: Current Every Day Smoker    Packs/day: 2.00    Types: Cigarettes  . Smokeless tobacco: Never Used  . Tobacco comment: Quit in January 2013, and started back in March.  Substance Use Topics  . Alcohol use: Yes    Alcohol/week: 0.0 oz    Comment: a couple of beers each week  . Drug use: Not on file    Allergies  Allergen Reactions  . Chantix  [Varenicline]     Crazy Dreams  . Wellbutrin Sr  [Bupropion Hcl]     Stomach pains    Current Outpatient Medications  Medication Sig Dispense Refill  . albuterol (VENTOLIN HFA) 108 (90 Base) MCG/ACT inhaler INHALE 2 PUFFS BY MOUTH EVERY 6 HOURS AS NEEDED 18 g 2  . ALPRAZolam (XANAX) 1 MG tablet One tablet 2 two to 3 times daily as needed 75 tablet 3  . apixaban (ELIQUIS) 5 MG  TABS tablet Take 1 tablet (5 mg total) by mouth 2 (two) times daily. 14 tablet 0  . atorvastatin (LIPITOR) 40 MG tablet Take 1 tablet (40 mg total) by mouth daily. Reported on 04/15/2015 30 tablet 3  . Melatonin 10 MG TABS Take by mouth.    . metoprolol succinate (TOPROL-XL) 25 MG 24 hr tablet Take 1 tablet (25 mg total) by mouth daily. 30 tablet 1  . Mometasone Furoate (ASMANEX HFA) 100 MCG/ACT AERO Inhale 2 puffs into the lungs 2 (two) times daily. 1 Inhaler 5  . sucralfate (CARAFATE) 1 g tablet Take 1 g by mouth 4 (four) times daily as needed.    . valACYclovir (VALTREX) 1000 MG tablet TAKE 2 TABLETS BY MOUTH TWICE DAILY AS NEEDED AS DIRECTED BY PHYSICIAN. 30 tablet 12   No current facility-administered medications for this visit.       REVIEW OF SYSTEMS (Negative unless checked)  Constitutional: _0 Weight loss  _1 Fever  _2 Chills Cardiac: _3 Chest pain   _4 Chest pressure   _5 Palpitations   _6 Shortness of breath when laying flat   _7 Shortness of breath at rest   _8 Shortness  of breath with exertion. Vascular:  _0 Pain in legs with walking   _1 Pain in legs at rest   _2 Pain in legs when laying flat   _3 Claudication   _4 Pain in feet when walking  _5 Pain in feet at rest  _6 Pain in feet when laying flat   _7 History of DVT   _8 Phlebitis   _9 Swelling in legs   _10 Varicose veins   _11 Non-healing ulcers Pulmonary:   _12 Uses home oxygen   _13 Productive cough   _14 Hemoptysis   _15 Wheeze  _16 COPD   _17 Asthma Neurologic:  _18 Dizziness  _19 Blackouts   _20 Seizures   _21 History of stroke   _22 History of TIA  _23 Aphasia   _24 Temporary blindness   _25 Dysphagia   _26 Weakness or numbness in arms   _27 Weakness or numbness in legs Musculoskeletal:  _28 Arthritis   _29 Joint swelling   _30 Joint pain   _31 Low back pain Hematologic:  _32 Easy bruising  _33 Easy bleeding   _34 Hypercoagulable state   _35 Anemic  _36 Hepatitis Gastrointestinal:  _37 Blood in stool   _38 Vomiting blood  _39 Gastroesophageal reflux/heartburn   _40 Abdominal pain Genitourinary:   _41 Chronic kidney disease   _42 Difficult urination  _43 Frequent urination  _44 Burning with urination   _45 Hematuria Skin:  _46 Rashes   _47 Ulcers   _48 Wounds Psychological:  _49 History of anxiety   _50  History of major depression.    Physical Exam BP 124/88 (BP Location: Right Arm, Patient Position: Sitting)   Pulse 88   Resp 18   Ht _51  (1.727 m)   Wt 127.5 kg (281 lb)   BMI 42.73 kg/m  Gen:  WD/WN, NAD Head: [place]/AT, No temporalis wasting Ear/Nose/Throat: Hearing grossly intact, nares w/o erythema or drainage, oropharynx w/o Erythema/Exudate Eyes: Conjunctiva clear, sclera non-icteric  Neck: trachea midline.  Pulmonary:  Good air movement, respirations not labored, no use of accessory muscles Cardiac: RRR, no JVD Vascular:  Vessel Right Left  Radial Palpable Palpable                          PT Palpable Palpable  DP Palpable Palpable    Musculoskeletal: M/S 5/5 throughout.  Extremities without ischemic changes.  No deformity or atrophy. 1 LLE edema. Neurologic: Sensation grossly intact in extremities.  Symmetrical.  Speech is fluent. Motor exam as listed above. Psychiatric: Judgment intact, Mood & affect appropriate for pt's clinical situation. Dermatologic: No rashes or ulcers noted.  No cellulitis or open wounds.  Radiology US Venous Img Lower Unilateral Left  Result Date: 06/27/2017 CLINICAL DATA:  Left calf pain and swelling. EXAM: LEFT LOWER EXTREMITY VENOUS DOPPLER ULTRASOUND TECHNIQUE: Gray-scale sonography with graded compression, as well as color Doppler and duplex ultrasound were performed to evaluate the lower extremity deep venous systems from the level of the common femoral vein and including the common femoral, femoral, profunda femoral, popliteal and calf veins including the posterior tibial, peroneal and gastrocnemius veins when visible. The superficial great saphenous vein was also interrogated. Spectral Doppler was utilized to evaluate flow at rest and with distal  augmentation maneuvers in the common femoral, femoral and popliteal veins. COMPARISON:  None. FINDINGS: Contralateral Common Femoral Vein: No thrombus. Normal compressibility, phasicity and color Doppler flow. Common Femoral Vein: No evidence of thrombus. Normal compressibility, respiratory phasicity and response to augmentation. Saphenofemoral Junction: No evidence of thrombus. Normal compressibility and flow on color Doppler imaging. Profunda Femoral Vein: No evidence of thrombus. Normal compressibility and flow on color Doppler imaging. Femoral Vein: No evidence of thrombus. Normal compressibility, respiratory phasicity and response to augmentation. Popliteal  Vein: Incomplete compressibility of the left popliteal vein. Evidence for nonocclusive thrombus within the left popliteal vein. Calf Veins: Positive for thrombus within gastrocnemius veins. Gastrocnemius clot appears to be occlusive. Visualized peroneal and posterior tibial veins are patent without thrombus. Other Findings:  None. IMPRESSION: Positive for deep venous thrombosis in the left lower extremity. Occlusive thrombus in the left gastrocnemius veins. Nonocclusive thrombus in the left popliteal vein. Electronically Signed   By: Markus Daft M.D.   On: 06/27/2017 17:32    Labs Recent Results (from the past 2160 hour(s))  Lipid panel     Status: Abnormal   Collection Time: 05/11/17  4:50 PM  Result Value Ref Range   Cholesterol, Total 237 (H) 100 - 199 mg/dL   Triglycerides 646 (HH) 0 - 149 mg/dL   HDL 30 (L) >39 mg/dL   VLDL Cholesterol Cal Comment 5 - 40 mg/dL    Comment: The calculation for the VLDL cholesterol is not valid when triglyceride level is >400 mg/dL.    LDL Calculated Comment 0 - 99 mg/dL    Comment: Triglyceride result indicated is too high for an accurate LDL cholesterol estimation.    Chol/HDL Ratio 7.9 (H) 0.0 - 5.0 ratio    Comment:                                   T. Chol/HDL Ratio                                              Men  Women                               1/2 Avg.Risk  3.4    3.3                                   Avg.Risk  5.0    4.4                                2X Avg.Risk  9.6    7.1                                3X Avg.Risk 23.4   11.0   Comprehensive metabolic panel     Status: Abnormal   Collection Time: 05/11/17  4:50 PM  Result Value Ref Range   Glucose 100 (H) 65 - 99 mg/dL   BUN 12 6 - 20 mg/dL   Creatinine, Ser 0.95 0.76 - 1.27 mg/dL   GFR calc non Af Amer 100 >59 mL/min/1.73   GFR calc Af Amer 116 >59 mL/min/1.73   BUN/Creatinine Ratio 13 9 - 20   Sodium 140 134 - 144 mmol/L   Potassium 4.6 3.5 - 5.2 mmol/L   Chloride 104 96 - 106 mmol/L   CO2 21 20 - 29 mmol/L   Calcium 9.8 8.7 - 10.2 mg/dL   Total Protein 7.3 6.0 - 8.5 g/dL   Albumin 4.8 3.5 - 5.5 g/dL   Globulin, Total 2.5 1.5 - 4.5 g/dL   Albumin/Globulin Ratio  1.9 1.2 - 2.2   Bilirubin Total 0.3 0.0 - 1.2 mg/dL   Alkaline Phosphatase 75 39 - 117 IU/L   AST 22 0 - 40 IU/L   ALT 29 0 - 44 IU/L  Protime-INR     Status: None   Collection Time: 06/27/17  5:52 PM  Result Value Ref Range   Prothrombin Time 13.1 11.4 - 15.2 seconds   INR 1.00     Comment: Performed at [facility], [address] [place], [place] [address]  APTT     Status: None   Collection Time: 06/27/17  5:52 PM  Result Value Ref Range   aPTT 28 24 - 36 seconds    Comment: Performed at [facility], [address] [place], [place] [address]  CBC with Differential     Status: Abnormal   Collection Time: 06/27/17  5:52 PM  Result Value Ref Range   WBC 14.1 (H) 3.8 - 10.6 K/uL   RBC 4.75 4.40 - 5.90 MIL/uL   Hemoglobin 15.5 13.0 - 18.0 g/dL   HCT 44.7 40.0 - 52.0 %   MCV 94.2 80.0 - 100.0 fL   MCH 32.7 26.0 - 34.0 pg   MCHC 34.8 32.0 - 36.0 g/dL   RDW 12.6 11.5 - 14.5 %   Platelets 221 150 - 440 K/uL   Neutrophils Relative % 71 %   Neutro Abs 10.1 (H) 1.4 - 6.5 K/uL   Lymphocytes Relative 22 %   Lymphs Abs 3.1 1.0  - 3.6 K/uL   Monocytes Relative 4 %   Monocytes Absolute 0.6 0.2 - 1.0 K/uL   Eosinophils Relative 2 %   Eosinophils Absolute 0.2 0 - 0.7 K/uL   Basophils Relative 1 %   Basophils Absolute 0.1 0 - 0.1 K/uL    Comment: Performed at [facility], [address] [place], [place] [address]  Comprehensive metabolic panel     Status: None   Collection Time: 06/27/17  5:52 PM  Result Value Ref Range   Sodium 136 135 - 145 mmol/L   Potassium 4.1 3.5 - 5.1 mmol/L   Chloride 103 101 - 111 mmol/L   CO2 25 22 - 32 mmol/L   Glucose, Bld 96 65 - 99 mg/dL   BUN 15 6 - 20 mg/dL   Creatinine, Ser 0.85 0.61 - 1.24 mg/dL   Calcium 9.1 8.9 - 10.3 mg/dL   Total Protein 7.4 6.5 - 8.1 g/dL   Albumin 4.4 3.5 - 5.0 g/dL   AST 21 15 - 41 U/L   ALT 24 17 - 63 U/L   Alkaline Phosphatase 65 38 - 126 U/L   Total Bilirubin 0.6 0.3 - 1.2 mg/dL   GFR calc non Af Amer >60 >60 mL/min   GFR calc Af Amer >60 >60 mL/min    Comment: (NOTE) The eGFR has been calculated using the CKD EPI equation. This calculation has not been validated in all clinical situations. eGFR's persistently <60 mL/min signify possible Chronic Kidney Disease.    Anion gap 8 5 - 15    Comment: Performed at [facility], [address] [place], [place] [address]  Fibrin derivatives D-Dimer     Status: Abnormal   Collection Time: 06/27/17  5:52 PM  Result Value Ref Range   Fibrin derivatives D-dimer ([facility]) 1,749.74 (H) 0.00 - 499.00 ng/mL (FEU)    Comment: (NOTE) <> Exclusion of Venous Thromboembolism (VTE) - OUTPATIENT ONLY   (Emergency Department or [place])  0-499 ng/ml (FEU): With a low to intermediate pretest probability                      for VTE this test result excludes the diagnosis                      of VTE.   >499 ng/ml (FEU) : VTE not excluded; additional work up for VTE is                      required. <> Testing on Inpatients and Evaluation of Disseminated Intravascular   Coagulation (DIC)  Reference Range:   0-499 ng/ml (FEU) Performed at [facility], [address] [place], [place] [address]     Assessment/Plan:  Hyperlipidemia, mixed lipid control important in reducing the progression of atherosclerotic disease. Continue statin therapy   Tobacco abuse Likely increases his coagulopathy.  Cessation would be of benefit.    DVT (deep venous thrombosis) ([place]) The patient has a left popliteal and tibial vein DVT.  The appropriate therapy for this would be anticoagulation which was started in the emergency department yesterday.  He should continue this for at least 3 months.  We can consider an ultrasound in 3 months and since this was not a more proximal DVT, this may be adequate therapy if his ultrasound is indicative of good response.  He should wear compression stockings and elevate his legs.  He should continue activity as tolerated.      Leotis Pain 06/29/2017, 12:29 PM   This note was created with Dragon medical transcription system.  Any errors from dictation are unintentional.

## 2017-06-28 NOTE — Assessment & Plan Note (Addendum)
Likely increases his coagulopathy.  Cessation would be of benefit.

## 2017-06-28 NOTE — Assessment & Plan Note (Signed)
The patient has a left popliteal and tibial vein DVT.  The appropriate therapy for this would be anticoagulation which was started in the emergency department yesterday.  He should continue this for at least 3 months.  We can consider an ultrasound in 3 months and since this was not a more proximal DVT, this may be adequate therapy if his ultrasound is indicative of good response.  He should wear compression stockings and elevate his legs.  He should continue activity as tolerated.

## 2017-06-28 NOTE — Assessment & Plan Note (Signed)
lipid control important in reducing the progression of atherosclerotic disease. Continue statin therapy  

## 2017-06-29 NOTE — Patient Instructions (Signed)
Deep Vein Thrombosis Deep vein thrombosis (DVT) is a condition in which a blood clot forms in a deep vein, such as a lower leg, thigh, or arm vein. A clot is blood that has thickened into a gel or solid. This condition is dangerous. It can lead to serious and even life-threatening complications if the clot travels to the lungs and causes a blockage (pulmonary embolism). It can also damage veins in the leg. This can result in leg pain, swelling, discoloration, and sores (post-thrombotic syndrome). What are the causes? This condition may be caused by:  A slowdown of blood flow.  Damage to a vein.  A condition that makes blood clot more easily.  What increases the risk? The following factors may make you more likely to develop this condition:  Being overweight.  Being elderly, especially over age 60.  Sitting or lying down for more than four hours.  Lack of physical activity (sedentary lifestyle).  Being pregnant, giving birth, or having recently given birth.  Taking medicines that contain estrogen.  Smoking.  A history of any of the following: ? Blood clots or blood clotting disease. ? Peripheral vascular disease. ? Inflammatory bowel disease. ? Cancer. ? Heart disease. ? Genetic conditions that affect how blood clots. ? Neurological diseases that affect the legs (leg paresis). ? Injury. ? Major or lengthy surgery. ? A central line placed inside a large vein.  What are the signs or symptoms? Symptoms of this condition include:  Swelling, pain, or tenderness in an arm or leg.  Warmth, redness, or discoloration in an arm or leg.  If the clot is in your leg, symptoms may be more noticeable or worse when you stand or walk. Some people do not have any symptoms. How is this diagnosed? This condition is diagnosed with:  A medical history.  A physical exam.  Tests, such as: ? Blood tests. These are done to see how your blood clots. ? Imaging tests. These are done to  check for clots. Tests may include:  Ultrasound.  CT scan.  MRI.  X-ray.  Venogram. For this test, X-rays are taken after a dye is injected into a vein.  How is this treated? Treatment for this condition depends on the cause, your risk for bleeding or developing more clots, and any medical conditions you have. Treatment may include:  Taking blood thinners (also called anticoagulants). These medicines may be taken by mouth, injected under the skin, or injected through an IV tube (catheter). These medicines prevent clots from forming.  Injecting medicine that dissolves blood clots into the affected vein (catheter-directed thrombolysis).  Having surgery. Surgery may be done to: ? Remove the clot. ? Place a filter in a large vein to catch blood clots before they reach the lungs.  Some treatments may be continued for up to six months. Follow these instructions at home: If you are taking an oral blood thinner:  Take the medicine exactly as told by your health care provider. Some blood thinners need to be taken at the same time every day. Do not skip a dose.  Ask your health care provider about what foods and drugs interact with the medicine.  Ask about possible side effects. General instructions  Blood thinners can cause easy bruising and difficulty stopping bleeding. Because of this, if you are taking or were given a blood thinner: ? Hold pressure over cuts for longer than usual. ? Tell your dentist and other health care providers that you are taking blood thinners before   having any procedures that can cause bleeding. ? Avoid contact sports.  Take over-the-counter and prescription medicines only as told by your health care provider.  Return to your normal activities as told by your health care provider. Ask your health care provider what activities are safe for you.  Wear compression stockings if recommended by your health care provider.  Keep all follow-up visits as told by  your health care provider. This is important. How is this prevented? To lower your risk of developing this condition again:  For 30 or more minutes every day, do an activity that: ? Involves moving your arms and legs. ? Increases your heart rate.  When traveling for longer than four hours: ? Exercise your arms and legs every hour. ? Drink plenty of water. ? Avoid drinking alcohol.  Avoid sitting or lying for a long time without moving your legs.  Stay a healthy weight.  If you are a woman who is older than age 35, avoid unnecessary use of medicines that contain estrogen.  Do not use any products that contain nicotine or tobacco, such as cigarettes and e-cigarettes. This is especially important if you take estrogen medicines. If you need help quitting, ask your health care provider.  Contact a health care provider if:  You miss a dose of your blood thinner.  You have nausea, vomiting, or diarrhea that lasts for more than one day.  Your menstrual period is heavier than usual.  You have unusual bruising. Get help right away if:  You have new or increased pain, swelling, or redness in an arm or leg.  You have numbness or tingling in an arm or leg.  You have shortness of breath.  You have chest pain.  You have a rapid or irregular heartbeat.  You feel light-headed or dizzy.  You cough up blood.  There is blood in your vomit, stool, or urine.  You have a serious fall or accident, or you hit your head.  You have a severe headache or confusion.  You have a cut that will not stop bleeding. These symptoms may represent a serious problem that is an emergency. Do not wait to see if the symptoms will go away. Get medical help right away. Call your local emergency services (911 in the [place] Do not drive yourself to the hospital. Summary  DVT is a condition in which a blood clot forms in a deep vein, such as a lower leg, thigh, or arm vein.  Symptoms can include swelling,  warmth, pain, and redness in your leg or arm.  Treatment may include taking blood thinners, injecting medicine that dissolves blood clots,wearing compression stockings, or surgery.  If you are prescribed blood thinners, take them exactly as told. This information is not intended to replace advice given to you by your health care provider. Make sure you discuss any questions you have with your health care provider. Document Released: 03/08/2005 Document Revised: 04/10/2016 Document Reviewed: 04/10/2016 Elsevier Interactive Patient Education  2018 [employer]  

## 2017-07-03 ENCOUNTER — Encounter (INDEPENDENT_AMBULATORY_CARE_PROVIDER_SITE_OTHER): Payer: Self-pay | Admitting: Vascular Surgery

## 2017-07-04 ENCOUNTER — Telehealth: Payer: Self-pay | Admitting: Emergency Medicine

## 2017-07-04 ENCOUNTER — Telehealth (INDEPENDENT_AMBULATORY_CARE_PROVIDER_SITE_OTHER): Payer: Self-pay

## 2017-07-04 NOTE — Telephone Encounter (Signed)
Patient ran out of eloquis today.  They have been trying to contact Dr. Wyn Quakerew without success, and are worried that they will not get rx today.  Per Dr. Marisa SeverinSiadecki can call in eloquis 5 mg twice daily for 14 days.  Called to [facility] [address]

## 2017-07-04 NOTE — Telephone Encounter (Signed)
Called the patient to let him know that Dr. Wyn Quakerew hasn't answered the message concerning his refill yet, dur to him being in surgery all day. Once Dr. Wyn Quakerew answers the message, we will either call his prescription in or send it in electronically.  He states that his wife called the original prescriber of the medication and that doctor gave him a 7 day supply to tithe him over.

## 2017-07-04 NOTE — Telephone Encounter (Signed)
Patient called back again about the Eliquis. Said they havent heard anything.

## 2017-07-04 NOTE — Telephone Encounter (Signed)
Patient called requesting a refill on his Eliquis. He states that he is supposed to take this medication for 3 months.  Is it okay to send his refill in for him? What dosage and frequency does he need to be?

## 2017-07-04 NOTE — Telephone Encounter (Signed)
WIFE CALLED WITH DUPLICATE REQUEST. I INFORMED HER THAT A MESSAGE HAS BEEN FORWARDED TO THE PROVIDER AND SHE WILL BE CONTACTED.

## 2017-07-07 ENCOUNTER — Encounter: Payer: Self-pay | Admitting: Family Medicine

## 2017-07-08 ENCOUNTER — Other Ambulatory Visit: Payer: Self-pay | Admitting: *Deleted

## 2017-07-08 MED ORDER — MOMETASONE FUROATE 100 MCG/ACT IN AERO: 2.0000 | INHALATION_SPRAY | Freq: Two times a day (BID) | RESPIRATORY_TRACT | 5 refills | Status: AC

## 2017-07-08 MED ORDER — ALBUTEROL SULFATE HFA 108 (90 BASE) MCG/ACT IN AERS: INHALATION_SPRAY | RESPIRATORY_TRACT | 2 refills | Status: DC

## 2017-07-08 NOTE — Telephone Encounter (Signed)
Patient is requesting refill on inhalers. We need to know which pharmacy? LMOVM for pt to return call.

## 2017-07-12 ENCOUNTER — Other Ambulatory Visit: Payer: Self-pay | Admitting: Family Medicine

## 2017-07-12 MED ORDER — FLUTICASONE PROPIONATE HFA 110 MCG/ACT IN AERO: 2.0000 | INHALATION_SPRAY | Freq: Two times a day (BID) | RESPIRATORY_TRACT | 12 refills | Status: DC

## 2017-07-12 NOTE — Progress Notes (Signed)
Insurance covers flovent and qvar per fax from [facility]

## 2017-07-28 ENCOUNTER — Encounter: Payer: 59 | Admitting: Family Medicine

## 2017-08-12 ENCOUNTER — Other Ambulatory Visit: Payer: Self-pay | Admitting: Family Medicine

## 2017-08-12 DIAGNOSIS — E782 Mixed hyperlipidemia: Secondary | ICD-10-CM

## 2017-08-12 DIAGNOSIS — R030 Elevated blood-pressure reading, without diagnosis of hypertension: Secondary | ICD-10-CM

## 2017-08-12 MED ORDER — VALACYCLOVIR HCL 1 G PO TABS: ORAL_TABLET | ORAL | 12 refills | Status: DC

## 2017-08-12 MED ORDER — ATORVASTATIN CALCIUM 40 MG PO TABS: 40.0000 mg | ORAL_TABLET | Freq: Every day | ORAL | 5 refills | Status: DC

## 2017-08-12 MED ORDER — METOPROLOL SUCCINATE ER 25 MG PO TB24: 25.0000 mg | ORAL_TABLET | Freq: Every day | ORAL | 11 refills | Status: DC

## 2017-08-12 NOTE — Telephone Encounter (Signed)
Patient needs refills on Lipitor 40 mg., Metoprolol 25 mg. And Valtrex sent to [facility] at [address]

## 2017-08-16 ENCOUNTER — Encounter: Payer: Self-pay | Admitting: Family Medicine

## 2017-08-17 ENCOUNTER — Encounter: Payer: Self-pay | Admitting: Family Medicine

## 2017-08-17 ENCOUNTER — Ambulatory Visit (INDEPENDENT_AMBULATORY_CARE_PROVIDER_SITE_OTHER): Payer: 59 | Admitting: Family Medicine

## 2017-08-17 VITALS — BP 138/88 | HR 66 | Temp 97.5°F | Resp 20 | Ht 68.0 in | Wt 276.0 lb

## 2017-08-17 DIAGNOSIS — E782 Mixed hyperlipidemia: Secondary | ICD-10-CM | POA: Diagnosis not present

## 2017-08-17 DIAGNOSIS — Z0001 Encounter for general adult medical examination with abnormal findings: Secondary | ICD-10-CM | POA: Diagnosis not present

## 2017-08-17 DIAGNOSIS — B001 Herpesviral vesicular dermatitis: Secondary | ICD-10-CM | POA: Diagnosis not present

## 2017-08-17 DIAGNOSIS — Z0000 Encounter for general adult medical examination without abnormal findings: Secondary | ICD-10-CM

## 2017-08-17 DIAGNOSIS — I82432 Acute embolism and thrombosis of left popliteal vein: Secondary | ICD-10-CM | POA: Diagnosis not present

## 2017-08-17 DIAGNOSIS — F39 Unspecified mood [affective] disorder: Secondary | ICD-10-CM

## 2017-08-17 DIAGNOSIS — G5601 Carpal tunnel syndrome, right upper limb: Secondary | ICD-10-CM | POA: Diagnosis not present

## 2017-08-17 DIAGNOSIS — Z720 Tobacco use: Secondary | ICD-10-CM

## 2017-08-17 DIAGNOSIS — F17210 Nicotine dependence, cigarettes, uncomplicated: Secondary | ICD-10-CM | POA: Diagnosis not present

## 2017-08-17 MED ORDER — VARENICLINE TARTRATE 0.5 MG X 11 & 1 MG X 42 PO MISC: ORAL | 1 refills | Status: DC

## 2017-08-17 NOTE — Patient Instructions (Addendum)
Let me know which pharmacy you can get valacyclovir and Lipitor (atorvastatin) filled   You need to quit smoking   If you take alprazolam right at bedtime, it should keep you from having crazy dreams from the Chantix.    You need to get your weight under 200 pounds over the next 12 months.    It is recommended to engage in 150 minutes of moderate exercise every week.    You need to get your cholesterol checked 3-4 weeks after starting back on atorvastatin. Call the office for lab order at that time.    Preventive Care 40-64 Years, Male Preventive care refers to lifestyle choices and visits with your health care provider that can promote health and wellness. What does preventive care include?  A yearly physical exam. This is also called an annual well check.  Dental exams once or twice a year.  Routine eye exams. Ask your health care provider how often you should have your eyes checked.  Personal lifestyle choices, including: ? Daily care of your teeth and gums. ? Regular physical activity. ? Eating a healthy diet. ? Avoiding tobacco and drug use. ? Limiting alcohol use. ? Practicing safe sex. ? Taking low-dose aspirin every day starting at age 63. What happens during an annual well check? The services and screenings done by your health care provider during your annual well check will depend on your age, overall health, lifestyle risk factors, and family history of disease. Counseling Your health care provider may ask you questions about your:  Alcohol use.  Tobacco use.  Drug use.  Emotional well-being.  Home and relationship well-being.  Sexual activity.  Eating habits.  Work and work Statistician.  Screening You may have the following tests or measurements:  Height, weight, and BMI.  Blood pressure.  Lipid and cholesterol levels. These may be checked every 5 years, or more frequently if you are over 7 years old.  Skin check.  Lung cancer  screening. You may have this screening every year starting at age 70 if you have a 30-pack-year history of smoking and currently smoke or have quit within the past 15 years.  Fecal occult blood test (FOBT) of the stool. You may have this test every year starting at age 18.  Flexible sigmoidoscopy or colonoscopy. You may have a sigmoidoscopy every 5 years or a colonoscopy every 10 years starting at age 40.  Prostate cancer screening. Recommendations will vary depending on your family history and other risks.  Hepatitis C blood test.  Hepatitis B blood test.  Sexually transmitted disease (STD) testing.  Diabetes screening. This is done by checking your blood sugar (glucose) after you have not eaten for a while (fasting). You may have this done every 1-3 years.  Discuss your test results, treatment options, and if necessary, the need for more tests with your health care provider. Vaccines Your health care provider may recommend certain vaccines, such as:  Influenza vaccine. This is recommended every year.  Tetanus, diphtheria, and acellular pertussis (Tdap, Td) vaccine. You may need a Td booster every 10 years.  Varicella vaccine. You may need this if you have not been vaccinated.  Zoster vaccine. You may need this after age 4.  Measles, mumps, and rubella (MMR) vaccine. You may need at least one dose of MMR if you were born in 1957 or later. You may also need a second dose.  Pneumococcal 13-valent conjugate (PCV13) vaccine. You may need this if you have certain conditions and  have not been vaccinated.  Pneumococcal polysaccharide (PPSV23) vaccine. You may need one or two doses if you smoke cigarettes or if you have certain conditions.  Meningococcal vaccine. You may need this if you have certain conditions.  Hepatitis A vaccine. You may need this if you have certain conditions or if you travel or work in places where you may be exposed to hepatitis A.  Hepatitis B vaccine. You  may need this if you have certain conditions or if you travel or work in places where you may be exposed to hepatitis B.  Haemophilus influenzae type b (Hib) vaccine. You may need this if you have certain risk factors.  Talk to your health care provider about which screenings and vaccines you need and how often you need them. This information is not intended to replace advice given to you by your health care provider. Make sure you discuss any questions you have with your health care provider. Document Released: 04/04/2015 Document Revised: 11/26/2015 Document Reviewed: 01/07/2015 Elsevier Interactive Patient Education  [employer].

## 2017-08-17 NOTE — Progress Notes (Signed)
Patient: Todd Chen, Male    DOB: 10/13/1975, 42 y.o.   MRN: [id number] Visit Date: 08/17/2017  Today's Provider: Mila Merry, MD   Chief Complaint  Patient presents with  . Annual Exam  . Anxiety  . Hyperlipidemia   Subjective:    Annual physical exam Todd Chen is a 42 y.o. male who presents today for health maintenance and complete physical. He feels fairly well. He reports exercising daily. He reports he is sleeping poorly.  -----------------------------------------------------------------    Lipid/Cholesterol, Follow-up:   Last seen for this 3 months ago.  Management since that visit includes; labs checked. Increased atorvastatin to 40 mg qd.  Last Lipid Panel:    Component Value Date/Time   CHOL 237 (H) 05/11/2017 1650   TRIG 646 (HH) 05/11/2017 1650   HDL 30 (L) 05/11/2017 1650   CHOLHDL 7.9 (H) 05/11/2017 1650   LDLCALC Comment 05/11/2017 1650    He reports good compliance with treatment. He is not having side effects.   Wt Readings from Last 3 Encounters:  08/17/17 276 lb (125.2 kg)  06/28/17 281 lb (127.5 kg)  06/27/17 273 lb (123.8 kg)    ------------------------------------------------------------------------  Anxiety, generalized From 05/11/2017-no changes. On 06/21/2017 changed alprazolam to 1 tablet 2-3 times daily pm. Today patient comes in reporting good compliance with treatment, good tolerance and fair symptom control. States he usually only takes alprazolam at night before bed and it is the only the thing that helps him get to sleep. States he does not take alprazolam when he has had any alcohol to drink  Elevated blood pressure reading From 05/11/2017-started metoprolol succinate (TOPROL-XL) 25 Mg qd. Patient reports good compliance with treatment and good tolerance.   Follow up DVT  Patient was seen in ER for DVT on 06/27/2017. He was treated for DVT. Treatment for this included; started on Elliquis and followed up with  Dr. Wyn Quaker Vascular surgeon on 06/28/2017 as outpatient. Is tolerating well without adverse effect.  He reports good compliance with treatment. He reports this condition is Improved.  ------------------------------------------------------------------------------------  He does still smoke 1-2 ppd. Tried bupropion which he states caused stomach pains. Tried Chantix which he stopped after a couple of weeks due to causing 'crazy dreams'     Review of Systems  Constitutional: Positive for activity change. Negative for appetite change, chills, fatigue and fever.  HENT: Positive for hearing loss and mouth sores. Negative for congestion, ear pain, nosebleeds and trouble swallowing.   Eyes: Negative for pain and visual disturbance.  Respiratory: Positive for wheezing. Negative for cough, chest tightness and shortness of breath.   Cardiovascular: Negative for chest pain, palpitations and leg swelling.  Gastrointestinal: Negative for abdominal pain, blood in stool, constipation, diarrhea, nausea and vomiting.  Endocrine: Negative for polydipsia, polyphagia and polyuria.  Genitourinary: Negative for dysuria and flank pain.  Musculoskeletal: Positive for arthralgias and back pain. Negative for joint swelling, myalgias and neck stiffness.  Skin: Negative for color change, rash and wound.  Neurological: Negative for dizziness, tremors, seizures, speech difficulty, weakness, light-headedness and headaches.  Psychiatric/Behavioral: Positive for sleep disturbance. Negative for behavioral problems, confusion, decreased concentration and dysphoric mood. The patient is nervous/anxious and is hyperactive.   All other systems reviewed and are negative.   Social History      He  reports that he has been smoking cigarettes.  He has been smoking about 1.50 packs per day. He has never used smokeless tobacco. He reports that he  drinks alcohol.       Social History   Socioeconomic History  . Marital status: Married      Spouse name: Not on file  . Number of children: Not on file  . Years of education: Not on file  . Highest education level: Not on file  Occupational History  . Not on file  Social Needs  . Financial resource strain: Not on file  . Food insecurity:    Worry: Not on file    Inability: Not on file  . Transportation needs:    Medical: Not on file    Non-medical: Not on file  Tobacco Use  . Smoking status: Current Every Day Smoker    Packs/day: 1.50    Types: Cigarettes  . Smokeless tobacco: Never Used  . Tobacco comment: Quit in January 2013, and started back in March.  Substance and Sexual Activity  . Alcohol use: Yes    Alcohol/week: 0.0 oz    Comment: a couple of beers each week or less  . Drug use: Not on file  . Sexual activity: Not on file  Lifestyle  . Physical activity:    Days per week: Not on file    Minutes per session: Not on file  . Stress: Not on file  Relationships  . Social connections:    Talks on phone: Not on file    Gets together: Not on file    Attends religious service: Not on file    Active member of club or organization: Not on file    Attends meetings of clubs or organizations: Not on file    Relationship status: Not on file  Other Topics Concern  . Not on file  Social History Narrative  . Not on file    Past Medical History:  Diagnosis Date  . Chronic back pain   . Herpes labialis   . PUD (peptic ulcer disease)   . Scrotal cyst      Patient Active Problem List   Diagnosis Date Noted  . DVT (deep venous thrombosis) (HCC) 06/28/2017  . Elevated blood pressure reading 05/11/2017  . Tobacco abuse 10/11/2016  . Backache 08/28/2014  . Anxiety, generalized 08/28/2014  . Acid reflux 08/28/2014  . Cold sore 08/28/2014  . Hyperlipidemia, mixed 08/28/2014  . Insomnia 08/28/2014  . Episodic mood disorder (HCC) 08/28/2014  . Obesity 08/28/2014  . Sebaceous cyst 08/28/2014    Past Surgical History:  Procedure Laterality Date  .  CHOLECYSTECTOMY  2002    Family History        Family Status  Relation Name Status  . Mother  Alive  . Father  Alive  . Brother  (Not Specified)  . Brother  (Not Specified)  . Sister  (Not Specified)        His family history includes Diabetes in his father; Emphysema in his mother; Hyperlipidemia in his father; Hypertension in his father.      Allergies  Allergen Reactions  . Chantix  [Varenicline]     Crazy Dreams  . Wellbutrin Sr  [Bupropion Hcl]     Stomach pains     Current Outpatient Medications:  .  albuterol (VENTOLIN HFA) 108 (90 Base) MCG/ACT inhaler, INHALE 2 PUFFS BY MOUTH EVERY 6 HOURS AS NEEDED, Disp: 18 g, Rfl: 2 .  ALPRAZolam (XANAX) 1 MG tablet, One tablet 2 two to 3 times daily as needed, Disp: 75 tablet, Rfl: 3 .  apixaban (ELIQUIS) 5 MG TABS tablet, Take 1 tablet (5  mg total) by mouth 2 (two) times daily., Disp: 14 tablet, Rfl: 0 .  atorvastatin (LIPITOR) 40 MG tablet, Take 1 tablet (40 mg total) by mouth daily. Reported on 04/15/2015, Disp: 30 tablet, Rfl: 5 .  Melatonin 10 MG TABS, Take by mouth., Disp: , Rfl:  .  metoprolol succinate (TOPROL-XL) 25 MG 24 hr tablet, Take 1 tablet (25 mg total) by mouth daily., Disp: 30 tablet, Rfl: 11 .  Mometasone Furoate (ASMANEX HFA) 100 MCG/ACT AERO, Inhale 2 puffs into the lungs 2 (two) times daily., Disp: 1 Inhaler, Rfl: 5 .  sucralfate (CARAFATE) 1 g tablet, Take 1 g by mouth 4 (four) times daily as needed., Disp: , Rfl:  .  valACYclovir (VALTREX) 1000 MG tablet, TAKE 2 TABLETS BY MOUTH TWICE DAILY AS NEEDED AS DIRECTED BY PHYSICIAN., Disp: 30 tablet, Rfl: 12 .  fluticasone (FLOVENT HFA) 110 MCG/ACT inhaler, Inhale 2 puffs into the lungs 2 (two) times daily. (Patient not taking: Reported on 08/17/2017), Disp: 1 Inhaler, Rfl: 12   Patient Care Team: [name], MD as PCP - General (Family Medicine)      Objective:   Vitals: BP 138/88 (BP Location: Right Arm, Patient Position: Sitting, Cuff Size: Large)    Pulse 66   Temp (!) 97.5 F (36.4 C) (Oral)   Resp 20   Ht  (1.727 m)   Wt 276 lb (125.2 kg)   SpO2 97% Comment: room air  BMI 41.97 kg/m    Vitals:   08/17/17 0920  BP: 138/88  Pulse: 66  Resp: 20  Temp: (!) 97.5 F (36.4 C)  TempSrc: Oral  SpO2: 97%  Weight: 276 lb (125.2 kg)  Height:  (1.727 m)     Physical Exam   General Appearance:    Alert, cooperative, no distress, appears stated age, morbidly obese  Head:    Normocephalic, without obvious abnormality, atraumatic  Eyes:    PERRL, conjunctiva/corneas clear, EOM's intact, fundi    benign, both eyes       Ears:    Normal TM's and external ear canals, both ears  Nose:   Nares normal, septum midline, mucosa normal, no drainage   or sinus tenderness  Throat:   Lips, mucosa, and tongue normal; teeth and gums normal  Neck:   Supple, symmetrical, trachea midline, no adenopathy;       thyroid:  No enlargement/tenderness/nodules; no carotid   bruit or JVD  Back:     Symmetric, no curvature, ROM normal, no CVA tenderness  Lungs:     Occasional expiratory wheeze, no rales, , respirations unlabored  Chest wall:    No tenderness or deformity  Heart:    Regular rate and rhythm, S1 and S2 normal, no murmur, rub   or gallop  Abdomen:     Soft, non-tender, bowel sounds active all four quadrants,    no masses, no organomegaly  Genitalia:    deferred  Rectal:    deferred  Extremities:   Extremities normal, atraumatic, no cyanosis or edema  Pulses:   2+ and symmetric all extremities  Skin:   Skin color, texture, turgor normal, no rashes or lesions  Lymph nodes:   Cervical, supraclavicular, and axillary nodes normal  Neurologic:   CNII-XII intact. Normal strength, sensation and reflexes      throughout    Depression Screen PHQ 2/9 Scores 08/17/2017 11/26/2016  PHQ - 2 Score 1 1  PHQ- 9 Score 7 9      Assessment &  Plan:     Routine Health Maintenance and Physical Exam  Exercise Activities and Dietary  recommendations Goals    None      Immunization History  Administered Date(s) Administered  . Influenza,inj,Quad PF,6+ Mos 11/26/2016  . Tdap 03/25/2010    Health Maintenance  Topic Date Due  . HIV Screening  01/22/1993  . INFLUENZA VACCINE  10/20/2017  . TETANUS/TDAP  03/25/2020     Discussed health benefits of physical activity, and encouraged him to engage in regular exercise appropriate for his age and condition.    -------------------------------------------------------------------- 1. Annual physical exam He is morbidly obese and needs to work on reducing calories to loose weight.   2. Hyperlipidemia, mixed Has been out of atorvastatin for 3-4 weeks. Advised to get it filled and call to have lipids checked after getting back on it for a few weeks.   3. Tobacco abuse Extensively counseled on health benefits of smoking cessation and risks of continuing smoking including increased risk of recurrent DVT and other throbotic events. He states only side effect of Chantix in past was vivid dreams. Advised alprazolam may help this, and we could try a longer acting bedtime benzodiazepine if it is a problem - varenicline (CHANTIX STARTING MONTH PAK) 0.5 MG X 11 & 1 MG X 42 tablet; Take one 0.5 mg tablet daily for 3 days, then take one 0.5 mg tablet twice daily for 4 days, then take one 1 mg tablet twice daily  Dispense: 53 tablet; Refill: 1  4. Acute deep vein thrombosis (DVT) of popliteal vein of left lower extremity (HCC) Doing well on Eliquis. Follow up vascular /asch   5. Episodic mood disorder (HCC) Intolerant to SSRIs. For now is doing fairly well with nighttime alprazolam.   6. Cold sores He states he really needs valtrex but [facility] wouldn't refill for unknown reasons, but was told he has to get through mail order pharmacy, but he doesn't know which mail order pharmacy. He asked how to get prescription filled. Advised he may need to call around and see what cash price is  at [employer], or call back and let me know which mail order pharmacy he wants to use.   Mila Merry, MD  [facility]

## 2017-09-07 ENCOUNTER — Encounter (INDEPENDENT_AMBULATORY_CARE_PROVIDER_SITE_OTHER): Payer: Self-pay | Admitting: Vascular Surgery

## 2017-09-13 ENCOUNTER — Telehealth: Payer: Self-pay | Admitting: Family Medicine

## 2017-09-13 DIAGNOSIS — E782 Mixed hyperlipidemia: Secondary | ICD-10-CM

## 2017-09-13 NOTE — Telephone Encounter (Signed)
Please contact patient and make sure he has started back on atorvastatin as we discussed at his ov. Last month. He needs to stop by lab fasting for lipids. Please print pended lab order and leave at lab.

## 2017-09-14 ENCOUNTER — Telehealth: Payer: Self-pay

## 2017-09-14 NOTE — Telephone Encounter (Addendum)
Patient states he has not been taking the atorvastatin. Patient states he has not picked the rx up from the pharmacy yet. Patient is going to check with the pharmacy to see if he can still get the prescriptions. Patient has been notified before that these rx's were sent to the pharmacy.

## 2017-09-14 NOTE — Telephone Encounter (Signed)
Sabrina From [facility] called stating that an  order for a stress test was placed 11/2016. Patient no showed his appointment and didn't reschedule. Martie LeeSabrina wants to know if patient still needs this stress test done? Call back [phone]

## 2017-09-14 NOTE — Telephone Encounter (Signed)
LMTCB

## 2017-09-14 NOTE — Telephone Encounter (Signed)
I don't think so, his chest pain had gone away when he was last seen in May.

## 2017-09-15 MED ORDER — ATORVASTATIN CALCIUM 40 MG PO TABS: 40.0000 mg | ORAL_TABLET | Freq: Every day | ORAL | 5 refills | Status: DC

## 2017-09-16 NOTE — Telephone Encounter (Signed)
Todd LeeSabrina advised as below.

## 2017-09-27 ENCOUNTER — Ambulatory Visit (INDEPENDENT_AMBULATORY_CARE_PROVIDER_SITE_OTHER): Payer: 59 | Admitting: Vascular Surgery

## 2017-09-27 ENCOUNTER — Encounter (INDEPENDENT_AMBULATORY_CARE_PROVIDER_SITE_OTHER): Payer: 59

## 2017-10-04 ENCOUNTER — Other Ambulatory Visit: Payer: Self-pay | Admitting: Family Medicine

## 2017-10-04 DIAGNOSIS — F411 Generalized anxiety disorder: Secondary | ICD-10-CM

## 2017-10-04 NOTE — Telephone Encounter (Signed)
Patient is overdue to check lipids since starting back on atorvastatin, he needs to have labs done before we can approve refills medication.

## 2017-10-06 NOTE — Telephone Encounter (Signed)
Tried calling patient. Left message to call back. 

## 2017-10-07 NOTE — Telephone Encounter (Signed)
Advised patient as below. Patient reports that he will stop by next week and have labwork done.

## 2017-10-20 ENCOUNTER — Encounter: Payer: Self-pay | Admitting: Family Medicine

## 2017-10-20 DIAGNOSIS — E782 Mixed hyperlipidemia: Secondary | ICD-10-CM | POA: Diagnosis not present

## 2017-10-20 DIAGNOSIS — F411 Generalized anxiety disorder: Secondary | ICD-10-CM

## 2017-10-21 ENCOUNTER — Telehealth: Payer: Self-pay | Admitting: *Deleted

## 2017-10-21 DIAGNOSIS — Z720 Tobacco use: Secondary | ICD-10-CM

## 2017-10-21 LAB — COMPREHENSIVE METABOLIC PANEL
A/G RATIO: 1.8 (ref 1.2–2.2)
ALBUMIN: 4.5 g/dL (ref 3.5–5.5)
ALT: 32 IU/L (ref 0–44)
AST: 21 IU/L (ref 0–40)
Alkaline Phosphatase: 78 IU/L (ref 39–117)
BILIRUBIN TOTAL: 0.4 mg/dL (ref 0.0–1.2)
BUN / CREAT RATIO: 18 (ref 9–20)
BUN: 18 mg/dL (ref 6–20)
CALCIUM: 9.4 mg/dL (ref 8.7–10.2)
CHLORIDE: 102 mmol/L (ref 96–106)
CO2: 20 mmol/L (ref 20–29)
Creatinine, Ser: 1 mg/dL (ref 0.76–1.27)
GFR calc Af Amer: 109 mL/min/{1.73_m2} (ref >59)
GFR, EST NON AFRICAN AMERICAN: 94 mL/min/{1.73_m2} (ref >59)
GLOBULIN, TOTAL: 2.5 g/dL (ref 1.5–4.5)
GLUCOSE: 125 mg/dL — AB (ref 65–99)
POTASSIUM: 4.3 mmol/L (ref 3.5–5.2)
Sodium: 140 mmol/L (ref 134–144)
TOTAL PROTEIN: 7 g/dL (ref 6.0–8.5)

## 2017-10-21 LAB — LIPID PANEL
CHOLESTEROL TOTAL: 173 mg/dL (ref 100–199)
Chol/HDL Ratio: 6.4 ratio — ABNORMAL HIGH (ref 0.0–5.0)
HDL: 27 mg/dL — ABNORMAL LOW (ref >39)
Triglycerides: 453 mg/dL — ABNORMAL HIGH (ref 0–149)

## 2017-10-21 MED ORDER — VARENICLINE TARTRATE 0.5 MG X 11 & 1 MG X 42 PO MISC: ORAL | 1 refills | Status: AC

## 2017-10-21 MED ORDER — ALPRAZOLAM 1 MG PO TABS: ORAL_TABLET | ORAL | 3 refills | Status: DC

## 2017-10-21 NOTE — Telephone Encounter (Signed)
Patient was notified of results. Expressed understanding.  

## 2017-10-21 NOTE — Telephone Encounter (Signed)
Left message advising patient Rx has been sent to pharmacy.

## 2017-10-21 NOTE — Telephone Encounter (Signed)
LMTCB

## 2017-10-21 NOTE — Telephone Encounter (Signed)
Pt returning call regarding labs °

## 2017-10-21 NOTE — Telephone Encounter (Signed)
Patient was given an rx for varenicline (CHANTIX STARTING MONTH PAK) 0.5 MG X 11 & 1 MG X 42 tablet; Take one 0.5 mg tablet daily for 3 days, then take one 0.5 mg tablet twice daily for 4 days, then take one 1 mg tablet twice daily  Dispense: 53 tablet; Refill: 08/17/2017. Patient states he never had the prescription filled and wants to know if he can have [facility] [place] Please advise?

## 2017-10-21 NOTE — Telephone Encounter (Signed)
[facility] 

## 2017-10-21 NOTE — Telephone Encounter (Signed)
-----   Message from Malva Limesonald E [name], MD sent at 10/21/2017  7:57 AM EDT ----- Cholesterol down from 237 to 173. Triglycerides down from 646 to 453. Better but still a little high. Continue atorvastatin 40mg  for now. Avoid all sugars and white starchy foods which drive up triglycerides. Follow up 5-6 months.

## 2017-10-21 NOTE — Telephone Encounter (Signed)
LMOVM for pt to return call 

## 2017-11-07 ENCOUNTER — Other Ambulatory Visit: Payer: Self-pay | Admitting: Family Medicine

## 2017-11-07 ENCOUNTER — Other Ambulatory Visit (INDEPENDENT_AMBULATORY_CARE_PROVIDER_SITE_OTHER): Payer: Self-pay | Admitting: Vascular Surgery

## 2017-11-09 NOTE — Telephone Encounter (Signed)
Patient had labs done and prescription was refilled on 10/21/2017.

## 2017-11-21 ENCOUNTER — Encounter: Payer: Self-pay | Admitting: Family Medicine

## 2017-11-22 ENCOUNTER — Telehealth: Payer: Self-pay | Admitting: *Deleted

## 2017-11-22 MED ORDER — VARENICLINE TARTRATE 1 MG PO TABS: 1.0000 mg | ORAL_TABLET | Freq: Two times a day (BID) | ORAL | 6 refills | Status: AC

## 2017-11-22 NOTE — Telephone Encounter (Signed)
Patient sent e-mail requesting refill on Chantix. Please advise?

## 2017-11-30 ENCOUNTER — Encounter

## 2017-11-30 ENCOUNTER — Ambulatory Visit (INDEPENDENT_AMBULATORY_CARE_PROVIDER_SITE_OTHER): Payer: 59 | Admitting: Nurse Practitioner

## 2017-11-30 ENCOUNTER — Encounter (INDEPENDENT_AMBULATORY_CARE_PROVIDER_SITE_OTHER): Payer: 59

## 2017-12-14 ENCOUNTER — Other Ambulatory Visit: Payer: Self-pay | Admitting: Family Medicine

## 2017-12-14 MED ORDER — VALACYCLOVIR HCL 1 G PO TABS: ORAL_TABLET | ORAL | 2 refills | Status: DC

## 2017-12-14 NOTE — Telephone Encounter (Signed)
Pt needing a refill on valACYclovir (VALTREX) 1000 MG tablet called into a new pharmacy.  Please fill at:  [id number] - [place], [place] - [address] AT [facility] [address] [phone] (Phone) [phone] (Fax)   Thanks, [facility]

## 2018-01-09 ENCOUNTER — Other Ambulatory Visit: Payer: Self-pay | Admitting: Family Medicine

## 2018-01-09 DIAGNOSIS — F411 Generalized anxiety disorder: Secondary | ICD-10-CM

## 2018-01-18 ENCOUNTER — Other Ambulatory Visit (INDEPENDENT_AMBULATORY_CARE_PROVIDER_SITE_OTHER): Payer: Self-pay | Admitting: Vascular Surgery

## 2018-02-26 ENCOUNTER — Other Ambulatory Visit: Payer: Self-pay | Admitting: Family Medicine

## 2018-02-26 DIAGNOSIS — F411 Generalized anxiety disorder: Secondary | ICD-10-CM

## 2018-03-03 ENCOUNTER — Encounter

## 2018-03-03 ENCOUNTER — Ambulatory Visit (INDEPENDENT_AMBULATORY_CARE_PROVIDER_SITE_OTHER): Payer: 59 | Admitting: Vascular Surgery

## 2018-03-03 ENCOUNTER — Encounter (INDEPENDENT_AMBULATORY_CARE_PROVIDER_SITE_OTHER): Payer: 59

## 2018-03-04 ENCOUNTER — Encounter (INDEPENDENT_AMBULATORY_CARE_PROVIDER_SITE_OTHER): Payer: Self-pay

## 2018-03-06 ENCOUNTER — Encounter (INDEPENDENT_AMBULATORY_CARE_PROVIDER_SITE_OTHER): Payer: Self-pay

## 2018-03-08 ENCOUNTER — Telehealth: Payer: Self-pay | Admitting: Family Medicine

## 2018-04-20 ENCOUNTER — Other Ambulatory Visit: Payer: Self-pay | Admitting: Family Medicine

## 2018-04-20 DIAGNOSIS — E782 Mixed hyperlipidemia: Secondary | ICD-10-CM

## 2018-04-25 ENCOUNTER — Encounter: Payer: Self-pay | Admitting: Family Medicine

## 2018-04-25 ENCOUNTER — Ambulatory Visit (INDEPENDENT_AMBULATORY_CARE_PROVIDER_SITE_OTHER): Payer: 59 | Admitting: Family Medicine

## 2018-04-25 VITALS — BP 136/94 | HR 76 | Temp 97.9°F | Resp 20 | Ht 67.0 in | Wt 287.0 lb

## 2018-04-25 DIAGNOSIS — R739 Hyperglycemia, unspecified: Secondary | ICD-10-CM | POA: Diagnosis not present

## 2018-04-25 DIAGNOSIS — Z720 Tobacco use: Secondary | ICD-10-CM

## 2018-04-25 DIAGNOSIS — Z23 Encounter for immunization: Secondary | ICD-10-CM

## 2018-04-25 DIAGNOSIS — F411 Generalized anxiety disorder: Secondary | ICD-10-CM

## 2018-04-25 DIAGNOSIS — I82432 Acute embolism and thrombosis of left popliteal vein: Secondary | ICD-10-CM

## 2018-04-25 DIAGNOSIS — E782 Mixed hyperlipidemia: Secondary | ICD-10-CM

## 2018-04-25 DIAGNOSIS — F39 Unspecified mood [affective] disorder: Secondary | ICD-10-CM

## 2018-04-25 MED ORDER — VARENICLINE TARTRATE 0.5 MG X 11 & 1 MG X 42 PO MISC: ORAL | 0 refills | Status: AC

## 2018-04-25 NOTE — Progress Notes (Signed)
Patient: Todd Chen Male    DOB: 05/06/1975   42 y.o.   MRN: [id number] Visit Date: 04/25/2018  Today's Provider: Mila Merryonald Zamier Eggebrecht, MD   Chief Complaint  Patient presents with  . Hyperlipidemia  . Nicotine Dependence  . Hypertension   Subjective:     Nicotine Dependence  Presents for initial visit. Preferred tobacco types include cigarettes. His urge triggers include company of smokers. The symptoms have been worsening. He smokes 2 packs (Between 2-3 packs a day) of cigarettes per day. He started smoking when he was 7715-43 years old. Past treatments include varenicline and buproprion. The treatment provided moderate relief. Todd Chen is ready to quit.       Lipid/Cholesterol, Follow-up:   Last seen for this 6 months ago.  Management since that visit includes Continue current medications, and work on lifestyle changes.  Last Lipid Panel:    Component Value Date/Time   CHOL 173 10/20/2017 1415   TRIG 453 (H) 10/20/2017 1415   HDL 27 (L) 10/20/2017 1415   CHOLHDL 6.4 (H) 10/20/2017 1415   LDLCALC Comment 10/20/2017 1415    He reports excellent compliance with treatment. He is not having side effects.   Wt Readings from Last 3 Encounters:  04/25/18 287 lb (130.2 kg)  08/17/17 276 lb (125.2 kg)  06/28/17 281 lb (127.5 kg)    ------------------------------------------------------------------------  Follow up anxiety.  Reports that alprazolam continues to work well, mainly takes in the evenings. Has tried several SSRIs and SNRIs in the past which he did not tolerate.    Allergies  Allergen Reactions  . Chantix  [Varenicline]     Crazy Dreams  . Wellbutrin Sr  [Bupropion Hcl]     Stomach pains     Current Outpatient Medications:  .  albuterol (VENTOLIN HFA) 108 (90 Base) MCG/ACT inhaler, INHALE 2 PUFFS BY MOUTH EVERY 6 HOURS AS NEEDED, Disp: 18 g, Rfl: 4 .  ALPRAZolam (XANAX) 1 MG tablet, TAKE 1 TABLET BY MOUTH 2 TO 3 TIMES DAILY AS NEEDED, Disp: 75 tablet,  Rfl: 1 .  atorvastatin (LIPITOR) 40 MG tablet, TAKE 1 TABLET (40 MG TOTAL) BY MOUTH DAILY., Disp: 30 tablet, Rfl: 5 .  ELIQUIS 5 MG TABS tablet, TAKE 1 TABLET BY MOUTH TWICE DAILY (Patient taking differently: 5 mg daily. ), Disp: 60 tablet, Rfl: 4 .  metoprolol succinate (TOPROL-XL) 25 MG 24 hr tablet, Take 1 tablet (25 mg total) by mouth daily., Disp: 30 tablet, Rfl: 11 .  valACYclovir (VALTREX) 1000 MG tablet, TAKE 2 TABLETS BY MOUTH TWICE DAILY AS NEEDED AS DIRECTED BY PHYSICIAN., Disp: 30 tablet, Rfl: 2 .  fluticasone (FLOVENT HFA) 110 MCG/ACT inhaler, Inhale 2 puffs into the lungs 2 (two) times daily. (Patient not taking: Reported on 08/17/2017), Disp: 1 Inhaler, Rfl: 12 .  Melatonin 10 MG TABS, Take by mouth., Disp: , Rfl:  .  Mometasone Furoate (ASMANEX HFA) 100 MCG/ACT AERO, Inhale 2 puffs into the lungs 2 (two) times daily. (Patient not taking: Reported on 04/25/2018), Disp: 1 Inhaler, Rfl: 5 .  sucralfate (CARAFATE) 1 g tablet, Take 1 g by mouth 4 (four) times daily as needed., Disp: , Rfl:   Review of Systems  Constitutional: Negative.   Respiratory: Negative for apnea, cough, choking, chest tightness, shortness of breath, wheezing and stridor.   Cardiovascular: Negative for chest pain, palpitations and leg swelling.  Gastrointestinal: Negative.   Musculoskeletal: Positive for arthralgias and myalgias (left leg occasionally, pt is concerned for  a DVT.). Negative for back pain, gait problem, joint swelling, neck pain and neck stiffness.  Neurological: Negative for dizziness, light-headedness and headaches.    Social History   Tobacco Use  . Smoking status: Current Every Day Smoker    Packs/day: 1.50    Types: Cigarettes  . Smokeless tobacco: Never Used  . Tobacco comment: Quit in January 2013, and started back in March.  Substance Use Topics  . Alcohol use: Yes    Alcohol/week: 0.0 standard drinks    Comment: a couple of beers each week or less      Objective:   BP (!)  136/94 (BP Location: Right Arm, Patient Position: Sitting, Cuff Size: Large)   Pulse 76   Temp 97.9 F (36.6 C) (Oral)   Resp 20   Ht 5\' 7"  (1.702 m)   Wt 287 lb (130.2 kg)   BMI 44.95 kg/m  Vitals:   04/25/18 1047  BP: (!) 136/94  Pulse: 76  Resp: 20  Temp: 97.9 F (36.6 C)  TempSrc: Oral  Weight: 287 lb (130.2 kg)  Height: 5\' 7"  (1.702 m)     Physical Exam  General Appearance:    Alert, cooperative, no distress, morbidly obese  Eyes:    PERRL, conjunctiva/corneas clear, EOM's intact       Lungs:     Clear to auscultation bilaterally, respirations unlabored  Heart:    Regular rate and rhythm  Neurologic:   Awake, alert, oriented x 3. No apparent focal neurological           defect.           Assessment & Plan    1. Anxiety, generalized Stable on current regiment of alprazolam  2. Morbid obesity (HCC) He recognizes that he has terrible eating habits and is planning on getting serious about eating healthier. Discussed possibly trying medications to help with appetite, but he wants to try on his on for now.   3. Hyperglycemia  - Hemoglobin A1c  4. Episodic mood disorder (HCC)   5. Acute deep vein thrombosis (DVT) of popliteal vein of left lower extremity (HCC) Has completed course of Eliquis prescribed by Dr. Wyn Quaker. But he is still taking one a day until Dr. Kristeen Mans prescriptions run out.   6. Hyperlipidemia, mixed He is tolerating atorvastatin well with no adverse effects.   - Comprehensive metabolic panel - Lipid panel  7. Tobacco abuse  - varenicline (CHANTIX STARTING MONTH PAK) 0.5 MG X 11 & 1 MG X 42 tablet; Take one 0.5 mg tablet daily for 3 days, then take one 0.5 mg tablet twice daily for 4 days, then take one 1 mg tablet twice daily  Dispense: 53 tablet; Refill: 0  8. Need for influenza vaccination  - Flu Vaccine QUAD 6+ mos PF IM (Fluarix Quad PF)     Mila Merry, MD  [facility]

## 2018-04-25 NOTE — Patient Instructions (Signed)
. Please review the attached list of medications and notify my office if there are any errors.   . Please bring all of your medications to every appointment so we can make sure that our medication list is the same as yours.     Steps to Quit Smoking  Smoking tobacco can be bad for your health. It can also affect almost every organ in your body. Smoking puts you and people around you at risk for many serious long-lasting (chronic) diseases. Quitting smoking is hard, but it is one of the best things that you can do for your health. It is never too late to quit. What are the benefits of quitting smoking? When you quit smoking, you lower your risk for getting serious diseases and conditions. They can include:  Lung cancer or lung disease.  Heart disease.  Stroke.  Heart attack.  Not being able to have children (infertility).  Weak bones (osteoporosis) and broken bones (fractures). If you have coughing, wheezing, and shortness of breath, those symptoms may get better when you quit. You may also get sick less often. If you are pregnant, quitting smoking can help to lower your chances of having a baby of low birth weight. What can I do to help me quit smoking? Talk with your doctor about what can help you quit smoking. Some things you can do (strategies) include:  Quitting smoking totally, instead of slowly cutting back how much you smoke over a period of time.  Going to in-person counseling. You are more likely to quit if you go to many counseling sessions.  Using resources and support systems, such as: ? Agricultural engineer with a Veterinary surgeon. ? Phone quitlines. ? Automotive engineer. ? Support groups or group counseling. ? Text messaging programs. ? Mobile phone apps or applications.  Taking medicines. Some of these medicines may have nicotine in them. If you are pregnant or breastfeeding, do not take any medicines to quit smoking unless your doctor says it is okay. Talk with your  doctor about counseling or other things that can help you. Talk with your doctor about using more than one strategy at the same time, such as taking medicines while you are also going to in-person counseling. This can help make quitting easier. What things can I do to make it easier to quit? Quitting smoking might feel very hard at first, but there is a lot that you can do to make it easier. Take these steps:  Talk to your family and friends. Ask them to support and encourage you.  Call phone quitlines, reach out to support groups, or work with a Veterinary surgeon.  Ask people who smoke to not smoke around you.  Avoid places that make you want (trigger) to smoke, such as: ? Bars. ? Parties. ? Smoke-break areas at work.  Spend time with people who do not smoke.  Lower the stress in your life. Stress can make you want to smoke. Try these things to help your stress: ? Getting regular exercise. ? Deep-breathing exercises. ? Yoga. ? Meditating. ? Doing a body scan. To do this, close your eyes, focus on one area of your body at a time from head to toe, and notice which parts of your body are tense. Try to relax the muscles in those areas.  Download or buy apps on your mobile phone or tablet that can help you stick to your quit plan. There are many free apps, such as QuitGuide from the [employer] developer for Disease Control  and Prevention). You can find more support from smokefree.gov and other websites. This information is not intended to replace advice given to you by your health care provider. Make sure you discuss any questions you have with your health care provider. Document Released: 01/02/2009 Document Revised: 11/04/2015 Document Reviewed: 07/23/2014 Elsevier Interactive Patient Education  2019 [employer]

## 2018-04-26 ENCOUNTER — Telehealth: Payer: Self-pay

## 2018-04-26 LAB — COMPREHENSIVE METABOLIC PANEL
A/G RATIO: 2.1 (ref 1.2–2.2)
ALT: 41 IU/L (ref 0–44)
AST: 33 IU/L (ref 0–40)
Albumin: 4.7 g/dL (ref 4.0–5.0)
Alkaline Phosphatase: 76 IU/L (ref 39–117)
BUN/Creatinine Ratio: 15 (ref 9–20)
BUN: 15 mg/dL (ref 6–24)
Bilirubin Total: 0.6 mg/dL (ref 0.0–1.2)
CALCIUM: 9.7 mg/dL (ref 8.7–10.2)
CO2: 21 mmol/L (ref 20–29)
Chloride: 101 mmol/L (ref 96–106)
Creatinine, Ser: 0.99 mg/dL (ref 0.76–1.27)
GFR, EST AFRICAN AMERICAN: 110 mL/min/{1.73_m2} (ref >59)
GFR, EST NON AFRICAN AMERICAN: 95 mL/min/{1.73_m2} (ref >59)
GLOBULIN, TOTAL: 2.2 g/dL (ref 1.5–4.5)
Glucose: 109 mg/dL — ABNORMAL HIGH (ref 65–99)
POTASSIUM: 4.4 mmol/L (ref 3.5–5.2)
Sodium: 139 mmol/L (ref 134–144)
Total Protein: 6.9 g/dL (ref 6.0–8.5)

## 2018-04-26 LAB — LIPID PANEL
CHOL/HDL RATIO: 7.4 ratio — AB (ref 0.0–5.0)
Cholesterol, Total: 228 mg/dL — ABNORMAL HIGH (ref 100–199)
HDL: 31 mg/dL — ABNORMAL LOW (ref >39)
LDL Calculated: 123 mg/dL — ABNORMAL HIGH (ref 0–99)
Triglycerides: 369 mg/dL — ABNORMAL HIGH (ref 0–149)
VLDL Cholesterol Cal: 74 mg/dL — ABNORMAL HIGH (ref 5–40)

## 2018-04-26 LAB — HEMOGLOBIN A1C
ESTIMATED AVERAGE GLUCOSE: 111 mg/dL
HEMOGLOBIN A1C: 5.5 % (ref 4.8–5.6)

## 2018-04-26 NOTE — Telephone Encounter (Signed)
Pt returned your call.   ° °Thanks teri  °

## 2018-04-26 NOTE — Telephone Encounter (Signed)
LMTCB 04/26/2018  Thanks,   -Vernona Rieger

## 2018-04-26 NOTE — Telephone Encounter (Signed)
-----   Message from [name], MD sent at 04/26/2018  8:07 AM EST ----- Cholesterol is back up to 228, triglycerides still high at 369, need to increase atorvastatin to 80mg  a day, please send in prescription for #90, rf x 1  (he can finish his current bottle of 40mg  tablets by taking 2 a day) Sugar is normal. Need to schedule follow up in 3 months.

## 2018-04-27 MED ORDER — ATORVASTATIN CALCIUM 80 MG PO TABS: 80.0000 mg | ORAL_TABLET | Freq: Every day | ORAL | 1 refills | Status: DC

## 2018-04-27 NOTE — Telephone Encounter (Signed)
Pt advised.  RX sent to [facility] pharmacy.   Thanks,   -Vernona Rieger

## 2018-04-30 ENCOUNTER — Other Ambulatory Visit: Payer: Self-pay | Admitting: Family Medicine

## 2018-04-30 DIAGNOSIS — F411 Generalized anxiety disorder: Secondary | ICD-10-CM

## 2018-05-18 ENCOUNTER — Telehealth: Payer: Self-pay | Admitting: Family Medicine

## 2018-05-18 NOTE — Telephone Encounter (Signed)
Please check with patient and see how he is doing with Chantix starting pack. If doing well we can send prescription in for maintenance pack.

## 2018-05-18 NOTE — Telephone Encounter (Signed)
-----   Message from [name], MD sent at 05/16/2018  7:36 AM EST ----- Regarding: FW: rf continuing chantix rx if tolerating before end of feb  ----- Message ----- From: [name], MD Sent: 05/16/2018 To: [name], MD Subject: rf continuing chantix rx if tolerating befor#

## 2018-05-26 NOTE — Telephone Encounter (Signed)
LMTCB 05/26/2018  Thanks,   -Porshe Fleagle  

## 2018-05-27 NOTE — Telephone Encounter (Signed)
error 

## 2018-05-29 NOTE — Telephone Encounter (Signed)
Tried calling patient. Left message to call back. 

## 2018-05-29 NOTE — Telephone Encounter (Signed)
Patient called back stating that he is doing fine on the Chantix starting pack. He states he doesn't need a refill right now. He reports having more dreams since starting Chantix. He also states that has he has been able to cut back from smoking 3 ppd to now 1ppd. Patient states he would call back when he needs a refill.

## 2018-06-12 ENCOUNTER — Other Ambulatory Visit: Payer: Self-pay | Admitting: Family Medicine

## 2018-06-12 DIAGNOSIS — F411 Generalized anxiety disorder: Secondary | ICD-10-CM

## 2018-07-24 ENCOUNTER — Other Ambulatory Visit: Payer: Self-pay | Admitting: Family Medicine

## 2018-07-24 DIAGNOSIS — F411 Generalized anxiety disorder: Secondary | ICD-10-CM

## 2018-07-25 ENCOUNTER — Other Ambulatory Visit: Payer: Self-pay

## 2018-07-25 ENCOUNTER — Ambulatory Visit (INDEPENDENT_AMBULATORY_CARE_PROVIDER_SITE_OTHER): Payer: 59 | Admitting: Family Medicine

## 2018-07-25 ENCOUNTER — Encounter: Payer: Self-pay | Admitting: Family Medicine

## 2018-07-25 VITALS — BP 150/98 | HR 80 | Temp 98.3°F | Wt 288.0 lb

## 2018-07-25 DIAGNOSIS — S83412A Sprain of medial collateral ligament of left knee, initial encounter: Secondary | ICD-10-CM | POA: Diagnosis not present

## 2018-07-25 DIAGNOSIS — Z720 Tobacco use: Secondary | ICD-10-CM

## 2018-07-25 DIAGNOSIS — E782 Mixed hyperlipidemia: Secondary | ICD-10-CM | POA: Diagnosis not present

## 2018-07-25 MED ORDER — ATORVASTATIN CALCIUM 80 MG PO TABS: 80.0000 mg | ORAL_TABLET | Freq: Every day | ORAL | 1 refills | Status: DC

## 2018-07-25 NOTE — Progress Notes (Signed)
Patient: Todd Chen Male    DOB: 11-24-1975   42 y.o.   MRN: [id number] Visit Date: 07/25/2018  Today's Provider: Mila Merry, MD   Chief Complaint  Patient presents with  . Knee Pain   Subjective:     Knee Pain   The incident occurred 5 to 7 days ago. The injury mechanism was a twisting injury. The pain is present in the left knee. The pain has been constant since onset. Associated symptoms include a loss of motion, a loss of sensation and muscle weakness. Pertinent negatives include no inability to bear weight, numbness or tingling. He reports no foreign bodies present. The symptoms are aggravated by movement, weight bearing and palpation. He has tried immobilization for the symptoms.  Injured with lost footing stepping off of pontoon boot 4 days ago. Able to bear weight when wearing knee brace. Pain is medial side of knee. Has been taking up to 4 ibuprofen   He is also overdue for lipids, now on 80 mg atorvastatin which he is tolerating well.  Lipid Panel     Component Value Date/Time   CHOL 228 (H) 04/25/2018 1125   TRIG 369 (H) 04/25/2018 1125   HDL 31 (L) 04/25/2018 1125   CHOLHDL 7.4 (H) 04/25/2018 1125   LDLCALC 123 (H) 04/25/2018 1125     Allergies  Allergen Reactions  . Chantix  [Varenicline]     Crazy Dreams  . Wellbutrin Sr  [Bupropion Hcl]     Stomach pains     Current Outpatient Medications:  .  albuterol (VENTOLIN HFA) 108 (90 Base) MCG/ACT inhaler, INHALE 2 PUFFS BY MOUTH EVERY 6 HOURS AS NEEDED, Disp: 18 g, Rfl: 4 .  ALPRAZolam (XANAX) 1 MG tablet, TAKE 1 TABLET BY MOUTH 2 TO 3 TIMES DAILY AS NEEDED, Disp: 75 tablet, Rfl: 1 .  atorvastatin (LIPITOR) 80 MG tablet, Take 1 tablet (80 mg total) by mouth daily., Disp: 90 tablet, Rfl: 1 .  Melatonin 10 MG TABS, Take by mouth., Disp: , Rfl:  .  metoprolol succinate (TOPROL-XL) 25 MG 24 hr tablet, Take 1 tablet (25 mg total) by mouth daily., Disp: 30 tablet, Rfl: 11 .  valACYclovir (VALTREX) 1000 MG  tablet, TAKE 2 TABLETS BY MOUTH TWICE DAILY AS NEEDED AS DIRECTED BY PHYSICIAN., Disp: 30 tablet, Rfl: 2 .  ELIQUIS 5 MG TABS tablet, TAKE 1 TABLET BY MOUTH TWICE DAILY (Patient not taking: No sig reported), Disp: 60 tablet, Rfl: 4 .  Mometasone Furoate (ASMANEX HFA) 100 MCG/ACT AERO, Inhale 2 puffs into the lungs 2 (two) times daily. (Patient not taking: Reported on 04/25/2018), Disp: 1 Inhaler, Rfl: 5  Was almost  Review of Systems  Constitutional: Negative.   Musculoskeletal: Positive for arthralgias, gait problem and joint swelling. Negative for back pain, myalgias, neck pain and neck stiffness.  Neurological: Negative for tingling and numbness.    Social History   Tobacco Use  . Smoking status: Current Every Day Smoker    Packs/day: 1.50    Types: Cigarettes  . Smokeless tobacco: Never Used  . Tobacco comment: Quit in January 2013, and started back in March.  Substance Use Topics  . Alcohol use: Yes    Alcohol/week: 0.0 standard drinks    Comment: a couple of beers each week or less      Objective:   BP (!) 150/98 (BP Location: Right Arm, Patient Position: Sitting, Cuff Size: Large)   Pulse 80   Temp 98.3 F (  36.8 C) (Oral)   Wt 288 lb (130.6 kg)   BMI 45.11 kg/m  Vitals:   07/25/18 1437  BP: (!) 150/98  Pulse: 80  Temp: 98.3 F (36.8 C)  TempSrc: Oral  Weight: 288 lb (130.6 kg)     Physical Exam  General appearance: alert, well developed, well nourished, cooperative and in no distress. Morbidly obese Head: Normocephalic, without obvious abnormality, atraumatic Respiratory: Respirations even and unlabored, normal respiratory rate MS: Pain medial left knee reproduced with valgus force. Tender along MCL ligamen.t     Assessment & Plan    1. Sprain of medial collateral ligament of left knee, initial encounter Counseled on conservative treatments including rest, ice, and compression.   2. Hyperlipidemia, mixed He is tolerating atorvastatin well with no  adverse effects, but is out of medication. Is to check lipids a few weeks after starting back on medication.  - Comprehensive metabolic panel - Lipid panel - atorvastatin (LIPITOR) 80 MG tablet; Take 1 tablet (80 mg total) by mouth daily.  Dispense: 90 tablet; Refill: 1  3. Tobacco abuse Again encouraged to stop smoking.   4. Morbid obesity (HCC) Counseled regarding prudent diet and regular exercise.      The entirety of the information documented in the History of Present Illness, Review of Systems and Physical Exam were personally obtained by me. Portions of this information were initially documented by Kavin LeechLaura Walsh, CMA and reviewed by me for thoroughness and accuracy.   Mila Merryonald [name], MD  [facility]

## 2018-07-25 NOTE — Patient Instructions (Addendum)
. Please review the attached list of medications and notify my office if there are any errors.   . Please bring all of your medications to every appointment so we can make sure that our medication list is the same as yours.   . Please go to the lab draw station in [address] on the second floor [facility] while you are fasting and have been back on the atorvastatin for at least 2 weeks  . [facility]. Normal hours are 8:00am to 12:30pm and 1:30pm to 4:00pm Monday through Friday   Start taking 81mg  aspirin every day   Do not take any more than 4 tablets of ibuprofen at a time, no more than 3 times a day. Take with food.    Medial Collateral Knee Ligament Sprain  The medial collateral ligament (MCL) is a tough band of tissue in the knee that connects the thigh bone to the shin bone. Your MCL prevents your knee from moving too far inward and helps to keep your knee stable. An MCL sprain is an injury that is caused by stretching the MCL too far. The injury can involve a tear in the MCL. What are the causes? This condition may be caused by:  A hard, direct hit (blow) to the inside of your knee (common).  Your knee falling inward when you run, change directions quickly (cut), jump, or pivot.  Repeatedly overstretching the MCL. What increases the risk? The following factors make you more likely to develop this condition:  Playing contact sports, such as wrestling or football.  Participating in sports that involve cutting, like hockey, skiing, or soccer.  Having weak hip and core muscles. What are the signs or symptoms? Symptoms of this condition include:  A popping sound at the time of injury.  Pain on the inside of the knee.  Swelling in the knee.  Bruising around the knee.  Tenderness when pressing the inside of the knee.  Feeling unstable when you stand, like your knee will give way.  Difficulty walking on uneven surfaces. How is this diagnosed? This condition  may be diagnosed based on:  Your medical history.  A physical exam.  Tests, such as an X-ray or MRI. During your physical exam, your health care provider will check for pain, limited motion, and instability. How is this treated? Treatment for this condition depends on how severe the injury is. Treatment may include:  Keeping weight off the knee until swelling and pain improve.  Raising (elevating) the knee above the level of your heart. This helps to reduce swelling.  Icing the knee. This helps to reduce swelling.  Taking an NSAID. This helps to reduce pain and swelling.  Using a knee brace, elastic sleeve, or crutches while the injury heals.  Using a knee brace when participating in athletic activities.  Doing rehab exercises (physical therapy).  Surgery. This may be needed if: ? Your MCL tore all the way through. ? Your knee is unstable. ? Your knee is not getting better with other treatments. Follow these instructions at home: If you have a brace or sleeve:  Wear it as told by your health care provider. Remove it only as told by your health care provider.  Loosen the brace or remove the sleeve if your toes tingle, become numb, or turn cold and blue.  Do not let your brace or sleeve get wet if it is not waterproof.  Keep the brace or sleeve clean. Managing pain, stiffness, and swelling  If directed, apply ice to the inside of your knee. ? Put ice in a plastic bag. ? Place a towel between your skin and the bag. ? Leave the ice on for 20 minutes, 2-3 times a day.  Move your foot and toes often to avoid stiffness and to lessen swelling.  Elevate your knee above the level of your heart while you are sitting or lying down. Driving  Ask your health care provider when it is safe to drive if you have a brace or sleeve on your leg. Activity  Return to your normal activities as told by your health care provider. Ask your health care provider what activities are safe for  you.  Do exercises as told by your health care provider. Safety  Do not use the injured limb to support your body weight until your health care provider says that you can. Use crutches as told by your health care provider. General instructions  Take over-the-counter and prescription medicines only as told by your health care provider.  Keep all follow-up visits as told by your health care provider. This is important. How is this prevented?  Warm up and stretch before being active.  Cool down and stretch after being active.  Give your body time to rest between periods of activity.  Make sure to use equipment that fits you.  Be safe and responsible while being active to avoid falls.  Do at least 150 minutes of moderate-intensity exercise each week, such as brisk walking or water aerobics.  Maintain physical fitness, including: ? Strength. ? Flexibility. ? Cardiovascular fitness. ? Endurance. Contact a health care provider if:  Your symptoms do not improve.  Your symptoms get worse. This information is not intended to replace advice given to you by your health care provider. Make sure you discuss any questions you have with your health care provider. Document Released: 03/08/2005 Document Revised: 11/11/2015 Document Reviewed: 01/18/2015 Elsevier Interactive Patient Education  2019 [employer].

## 2018-07-26 ENCOUNTER — Ambulatory Visit: Payer: 59 | Admitting: Family Medicine

## 2018-08-16 ENCOUNTER — Other Ambulatory Visit: Payer: Self-pay | Admitting: Family Medicine

## 2018-08-30 ENCOUNTER — Telehealth: Payer: Self-pay | Admitting: Family Medicine

## 2018-08-30 NOTE — Telephone Encounter (Signed)
Pt states he was working on his diet and exercise first.  He just restarted atorvastatin, he says he will call back in a few weeks to get a lab order.   Thanks,   -Mickel Baas

## 2018-08-30 NOTE — Telephone Encounter (Signed)
Please check back with patient to see if he has started back on Lipitor (atorvastatin) he needs order to check lipids and met c if he has been back on it for at least 2 weeks.

## 2018-08-30 NOTE — Telephone Encounter (Signed)
-----   Message from Birdie Sons, MD sent at 08/08/2018  1:25 PM EDT ----- Regarding: make sure checks lipids at end of may, should be back on lipitor by then

## 2018-08-30 NOTE — Telephone Encounter (Signed)
LMTCB 08/30/2018   Thanks,    -Laura  

## 2018-09-17 ENCOUNTER — Other Ambulatory Visit: Payer: Self-pay | Admitting: Family Medicine

## 2018-09-17 DIAGNOSIS — F411 Generalized anxiety disorder: Secondary | ICD-10-CM

## 2018-10-04 ENCOUNTER — Telehealth: Payer: Self-pay | Admitting: Family Medicine

## 2018-10-04 DIAGNOSIS — E782 Mixed hyperlipidemia: Secondary | ICD-10-CM

## 2018-10-04 NOTE — Telephone Encounter (Signed)
Please advise is time to check lipids since starting on atorvastatin in June. Please print and leave order at lab. Needs to be fasting.

## 2018-10-05 NOTE — Telephone Encounter (Signed)
LMTCB 10/05/2018   Thanks,   -Laura  

## 2018-10-09 NOTE — Telephone Encounter (Signed)
Pt advised. Lab sheet at the front desk.   Thanks,   -Laura  

## 2018-10-11 DIAGNOSIS — G5601 Carpal tunnel syndrome, right upper limb: Secondary | ICD-10-CM | POA: Diagnosis not present

## 2018-10-11 DIAGNOSIS — E782 Mixed hyperlipidemia: Secondary | ICD-10-CM | POA: Diagnosis not present

## 2018-10-12 ENCOUNTER — Telehealth: Payer: Self-pay

## 2018-10-12 LAB — LIPID PANEL
Chol/HDL Ratio: 8.5 ratio — ABNORMAL HIGH (ref 0.0–5.0)
Cholesterol, Total: 212 mg/dL — ABNORMAL HIGH (ref 100–199)
HDL: 25 mg/dL — ABNORMAL LOW (ref >39)
Triglycerides: 576 mg/dL (ref 0–149)

## 2018-10-12 LAB — COMPREHENSIVE METABOLIC PANEL
ALT: 28 IU/L (ref 0–44)
AST: 25 IU/L (ref 0–40)
Albumin/Globulin Ratio: 2.4 — ABNORMAL HIGH (ref 1.2–2.2)
Albumin: 4.6 g/dL (ref 4.0–5.0)
Alkaline Phosphatase: 84 IU/L (ref 39–117)
BUN/Creatinine Ratio: 13 (ref 9–20)
BUN: 14 mg/dL (ref 6–24)
Bilirubin Total: 0.4 mg/dL (ref 0.0–1.2)
CO2: 22 mmol/L (ref 20–29)
Calcium: 9.4 mg/dL (ref 8.7–10.2)
Chloride: 104 mmol/L (ref 96–106)
Creatinine, Ser: 1.11 mg/dL (ref 0.76–1.27)
GFR calc Af Amer: 96 mL/min/{1.73_m2} (ref >59)
GFR calc non Af Amer: 83 mL/min/{1.73_m2} (ref >59)
Globulin, Total: 1.9 g/dL (ref 1.5–4.5)
Glucose: 89 mg/dL (ref 65–99)
Potassium: 4.1 mmol/L (ref 3.5–5.2)
Sodium: 142 mmol/L (ref 134–144)
Total Protein: 6.5 g/dL (ref 6.0–8.5)

## 2018-10-12 NOTE — Telephone Encounter (Signed)
Tried calling patient. Left message to call back. 

## 2018-10-12 NOTE — Telephone Encounter (Signed)
-----   Message from Birdie Sons, MD sent at 10/12/2018  8:51 AM EDT ----- Cholesterol is still high, triglycerides are very high at 576, HDL is low at 25. These are strong risk factors for causing heart disease. If he is not taking atorvastatin he needs to. If he is taking it then he needs to change to rosuvastatin 40mg  a day and check lipids in 8 weeks.

## 2018-10-16 ENCOUNTER — Encounter: Payer: Self-pay | Admitting: Family Medicine

## 2018-10-16 ENCOUNTER — Other Ambulatory Visit: Payer: Self-pay | Admitting: Family Medicine

## 2018-10-16 DIAGNOSIS — G5601 Carpal tunnel syndrome, right upper limb: Secondary | ICD-10-CM | POA: Insufficient documentation

## 2018-10-16 DIAGNOSIS — R030 Elevated blood-pressure reading, without diagnosis of hypertension: Secondary | ICD-10-CM

## 2018-10-17 MED ORDER — ROSUVASTATIN CALCIUM 40 MG PO TABS: 40.0000 mg | ORAL_TABLET | Freq: Every day | ORAL | 2 refills | Status: DC

## 2018-10-17 NOTE — Telephone Encounter (Signed)
Pt advised.   Thanks,   -Laura  

## 2018-10-17 NOTE — Telephone Encounter (Signed)
LMTCB 10/17/2018   Thanks,   -Juddson Cobern  

## 2018-10-17 NOTE — Telephone Encounter (Signed)
Pt advised.  RX sent to [facility].  Apt made for 12/20/2018.   Thanks,   -Mickel Baas

## 2018-10-25 DIAGNOSIS — G5601 Carpal tunnel syndrome, right upper limb: Secondary | ICD-10-CM | POA: Diagnosis not present

## 2018-10-26 DIAGNOSIS — G5601 Carpal tunnel syndrome, right upper limb: Secondary | ICD-10-CM | POA: Diagnosis not present

## 2018-11-03 ENCOUNTER — Encounter: Payer: Self-pay | Admitting: Family Medicine

## 2018-11-09 ENCOUNTER — Other Ambulatory Visit: Payer: Self-pay | Admitting: Family Medicine

## 2018-11-09 DIAGNOSIS — F411 Generalized anxiety disorder: Secondary | ICD-10-CM

## 2018-11-21 HISTORY — PX: CARPAL TUNNEL RELEASE: SHX101

## 2018-12-04 DIAGNOSIS — Z01818 Encounter for other preprocedural examination: Secondary | ICD-10-CM | POA: Diagnosis not present

## 2018-12-04 DIAGNOSIS — G5601 Carpal tunnel syndrome, right upper limb: Secondary | ICD-10-CM | POA: Diagnosis not present

## 2018-12-05 DIAGNOSIS — Z01812 Encounter for preprocedural laboratory examination: Secondary | ICD-10-CM | POA: Diagnosis not present

## 2018-12-05 DIAGNOSIS — Z20828 Contact with and (suspected) exposure to other viral communicable diseases: Secondary | ICD-10-CM | POA: Diagnosis not present

## 2018-12-08 DIAGNOSIS — Z86718 Personal history of other venous thrombosis and embolism: Secondary | ICD-10-CM | POA: Diagnosis not present

## 2018-12-08 DIAGNOSIS — F17210 Nicotine dependence, cigarettes, uncomplicated: Secondary | ICD-10-CM | POA: Diagnosis not present

## 2018-12-08 DIAGNOSIS — F909 Attention-deficit hyperactivity disorder, unspecified type: Secondary | ICD-10-CM | POA: Diagnosis not present

## 2018-12-08 DIAGNOSIS — E669 Obesity, unspecified: Secondary | ICD-10-CM | POA: Diagnosis not present

## 2018-12-08 DIAGNOSIS — I10 Essential (primary) hypertension: Secondary | ICD-10-CM | POA: Diagnosis not present

## 2018-12-08 DIAGNOSIS — Z79899 Other long term (current) drug therapy: Secondary | ICD-10-CM | POA: Diagnosis not present

## 2018-12-08 DIAGNOSIS — G5601 Carpal tunnel syndrome, right upper limb: Secondary | ICD-10-CM | POA: Diagnosis not present

## 2018-12-08 DIAGNOSIS — E7800 Pure hypercholesterolemia, unspecified: Secondary | ICD-10-CM | POA: Diagnosis not present

## 2018-12-08 DIAGNOSIS — J449 Chronic obstructive pulmonary disease, unspecified: Secondary | ICD-10-CM | POA: Diagnosis not present

## 2018-12-17 ENCOUNTER — Telehealth: Payer: Self-pay | Admitting: Family Medicine

## 2018-12-17 DIAGNOSIS — E782 Mixed hyperlipidemia: Secondary | ICD-10-CM

## 2018-12-17 NOTE — Telephone Encounter (Signed)
I will have to be out on the day of this patient's appointment on 12/20/2018. He is mainly coming in for labs and a BP check.  Please see if he just stop by for a nurse to check his BP and then get fasting labs drawn. We will call him back with lab results. If there is anything he needs to be seen for he can reschedule his appointment.

## 2018-12-18 NOTE — Telephone Encounter (Signed)
LMTCB-KW 

## 2018-12-20 ENCOUNTER — Ambulatory Visit: Payer: 59 | Admitting: Family Medicine

## 2018-12-20 NOTE — Telephone Encounter (Signed)
Patient rescheduled appointment for 01/08/2019. He plans to get labs done prior to this appointment so that he can review the results during that office visit.

## 2019-01-05 ENCOUNTER — Telehealth: Payer: Self-pay

## 2019-01-05 NOTE — Telephone Encounter (Signed)
That's fine. Can print order from 12/20/2018 and leave at lab for him. Needs to be fasting.

## 2019-01-05 NOTE — Telephone Encounter (Signed)
Patient is scheduled to come in the office on Monday 01/08/2019 for a follow up on cholesterol. He wants to know if he could just get a lab slip and have cholesterol levels rechecked instead of coming in for an office visit?  He prefers to not come in the office right now, and says the cholesterol was the only thing that Dr. Caryn Section wanted to recheck. Please advise.

## 2019-01-08 ENCOUNTER — Ambulatory Visit: Payer: 59 | Admitting: Family Medicine

## 2019-01-22 ENCOUNTER — Encounter: Payer: Self-pay | Admitting: Family Medicine

## 2019-01-24 ENCOUNTER — Other Ambulatory Visit: Payer: Self-pay | Admitting: Family Medicine

## 2019-01-24 DIAGNOSIS — F411 Generalized anxiety disorder: Secondary | ICD-10-CM

## 2019-01-25 ENCOUNTER — Other Ambulatory Visit: Payer: Self-pay | Admitting: Family Medicine

## 2019-01-25 DIAGNOSIS — F411 Generalized anxiety disorder: Secondary | ICD-10-CM

## 2019-01-25 DIAGNOSIS — E782 Mixed hyperlipidemia: Secondary | ICD-10-CM | POA: Diagnosis not present

## 2019-01-25 NOTE — Telephone Encounter (Signed)
Patient was supposed to have had lipids checked last month but still haven't got results. Please check with patient about when he can these done.

## 2019-01-25 NOTE — Telephone Encounter (Signed)
LMTCB

## 2019-01-25 NOTE — Telephone Encounter (Signed)
Pt coming in today to do his labs by 11:45.  Needing refills on:  ALPRAZolam (XANAX) 1 MG tablet valACYclovir (VALTREX) 1000 MG tablet - OUT  Please fill at:  [id number] - [place], [place] - [place] [phone] (Phone) [phone] (Fax)   Thanks, [facility]

## 2019-01-26 ENCOUNTER — Telehealth: Payer: Self-pay

## 2019-01-26 LAB — COMPREHENSIVE METABOLIC PANEL
ALT: 33 IU/L (ref 0–44)
AST: 22 IU/L (ref 0–40)
Albumin/Globulin Ratio: 2 (ref 1.2–2.2)
Albumin: 4.6 g/dL (ref 4.0–5.0)
Alkaline Phosphatase: 83 IU/L (ref 39–117)
BUN/Creatinine Ratio: 13 (ref 9–20)
BUN: 12 mg/dL (ref 6–24)
Bilirubin Total: 0.3 mg/dL (ref 0.0–1.2)
CO2: 20 mmol/L (ref 20–29)
Calcium: 9.9 mg/dL (ref 8.7–10.2)
Chloride: 102 mmol/L (ref 96–106)
Creatinine, Ser: 0.92 mg/dL (ref 0.76–1.27)
GFR calc Af Amer: 119 mL/min/{1.73_m2} (ref >59)
GFR calc non Af Amer: 103 mL/min/{1.73_m2} (ref >59)
Globulin, Total: 2.3 g/dL (ref 1.5–4.5)
Glucose: 117 mg/dL — ABNORMAL HIGH (ref 65–99)
Potassium: 4 mmol/L (ref 3.5–5.2)
Sodium: 140 mmol/L (ref 134–144)
Total Protein: 6.9 g/dL (ref 6.0–8.5)

## 2019-01-26 LAB — LIPID PANEL
Chol/HDL Ratio: 8 ratio — ABNORMAL HIGH (ref 0.0–5.0)
Cholesterol, Total: 233 mg/dL — ABNORMAL HIGH (ref 100–199)
HDL: 29 mg/dL — ABNORMAL LOW (ref >39)
LDL Chol Calc (NIH): 93 mg/dL (ref 0–99)
Triglycerides: 667 mg/dL (ref 0–149)
VLDL Cholesterol Cal: 111 mg/dL — ABNORMAL HIGH (ref 5–40)

## 2019-01-26 NOTE — Telephone Encounter (Signed)
LMTCB

## 2019-01-26 NOTE — Telephone Encounter (Signed)
-----   Message from Birdie Sons, MD sent at 01/26/2019  9:38 AM EST ----- Cholesterol isn't too bad, but Triglycerides are still high. Please verify whether patient has been taking crestor consistently.

## 2019-01-27 MED ORDER — VALACYCLOVIR HCL 1 G PO TABS: ORAL_TABLET | ORAL | 5 refills | Status: DC

## 2019-01-27 MED ORDER — ALPRAZOLAM 1 MG PO TABS: ORAL_TABLET | ORAL | 2 refills | Status: DC

## 2019-01-29 NOTE — Telephone Encounter (Signed)
LMTCB

## 2019-01-29 NOTE — Telephone Encounter (Signed)
Tried calling patient. Left message to call back. 

## 2019-01-31 NOTE — Telephone Encounter (Signed)
Patient said he had not been taking it every day and that he had gotten away from his diet.  He wants to get back on track with his diet and taking the medications before making any medication changes if that is ok with you.

## 2019-04-20 ENCOUNTER — Other Ambulatory Visit: Payer: Self-pay | Admitting: Family Medicine

## 2019-04-20 DIAGNOSIS — F411 Generalized anxiety disorder: Secondary | ICD-10-CM

## 2019-04-20 NOTE — Telephone Encounter (Signed)
Requested medication (s) are due for refill today: yes  Requested medication (s) are on the active medication list: yes  Last refill:  03/27/2019  Future visit scheduled: no  Notes to clinic:  This refill cannot be delegated   Requested Prescriptions  Pending Prescriptions Disp Refills   ALPRAZolam (XANAX) 1 MG tablet [Pharmacy Med Name: ALPRAZolam 1 MG TABS 1 Tablet] 75 tablet 2    Sig: TAKE ONE TABLET BY MOUTH TWO TO THREE TIMES DAILY AS NEEDED FOR ANXIETY      Not Delegated - Psychiatry:  Anxiolytics/Hypnotics Failed - 04/20/2019  7:44 AM      Failed - This refill cannot be delegated      Failed - Urine Drug Screen completed in last 360 days.      Failed - Valid encounter within last 6 months    Recent Outpatient Visits           8 months ago Sprain of medial collateral ligament of left knee, initial encounter   [facility] [name], MD   12 months ago Anxiety, generalized   [facility] [name], MD   1 year ago Annual physical exam   [facility] [name], MD   1 year ago Hyperlipidemia, mixed   [facility] [name], MD   2 years ago Precordial pain   [facility] [name], MD

## 2019-07-04 ENCOUNTER — Other Ambulatory Visit: Payer: Self-pay | Admitting: Family Medicine

## 2019-07-04 DIAGNOSIS — F411 Generalized anxiety disorder: Secondary | ICD-10-CM

## 2019-07-31 ENCOUNTER — Other Ambulatory Visit: Payer: Self-pay

## 2019-07-31 ENCOUNTER — Encounter: Payer: Self-pay | Admitting: Family Medicine

## 2019-07-31 ENCOUNTER — Ambulatory Visit (INDEPENDENT_AMBULATORY_CARE_PROVIDER_SITE_OTHER): Payer: 59 | Admitting: Family Medicine

## 2019-07-31 VITALS — BP 147/97 | HR 73 | Temp 97.1°F | Ht 67.0 in | Wt 286.8 lb

## 2019-07-31 DIAGNOSIS — Z720 Tobacco use: Secondary | ICD-10-CM

## 2019-07-31 DIAGNOSIS — R079 Chest pain, unspecified: Secondary | ICD-10-CM

## 2019-07-31 DIAGNOSIS — I10 Essential (primary) hypertension: Secondary | ICD-10-CM | POA: Diagnosis not present

## 2019-07-31 DIAGNOSIS — R739 Hyperglycemia, unspecified: Secondary | ICD-10-CM | POA: Insufficient documentation

## 2019-07-31 DIAGNOSIS — F411 Generalized anxiety disorder: Secondary | ICD-10-CM

## 2019-07-31 DIAGNOSIS — E782 Mixed hyperlipidemia: Secondary | ICD-10-CM

## 2019-07-31 NOTE — Progress Notes (Signed)
Established patient visit   Patient: Todd Chen   DOB: 04-Mar-1976   44 y.o. Male  MRN: [id number] Visit Date: 07/31/2019  Today's healthcare provider: Mila Merry, MD   Chief Complaint  Patient presents with  . Hyperlipidemia  . Anxiety  . Hypertension   Velora Mediate as a scribe for Mila Merry, MD.,have documented all relevant documentation on the behalf of Mila Merry, MD,as directed by  Mila Merry, MD while in the presence of Mila Merry, MD.  Subjective    HPI Lipid/Cholesterol, Follow-up  Last lipid panel Other pertinent labs  Lab Results  Component Value Date   CHOL 233 (H) 01/25/2019   HDL 29 (L) 01/25/2019   LDLCALC 93 01/25/2019   TRIG 667 (HH) 01/25/2019   CHOLHDL 8.0 (H) 01/25/2019   Lab Results  Component Value Date   ALT 33 01/25/2019   AST 22 01/25/2019   PLT 221 06/27/2017   TSH 0.897 11/24/2016     He was last seen for this 1 years ago.  Management since that visit includes increased Atorvastatin to 80 mg.  He reports poor compliance with treatment. Stopped taking medication 6 months ago due to increased hunger. He is having side effects. Has starting walking again. Smoking at least 2 packs a day. States he has chest pain when he exerts himself. Had stress test ordered in 2018, but he did not have it down. Gets short of breath when climbing stairs.   Symptoms: No chest pain No chest pressure/discomfort  No dyspnea No lower extremity edema  No numbness or tingling of extremity No orthopnea  No palpitations No paroxysmal nocturnal dyspnea  No speech difficulty No syncope   Current diet: well balanced Current exercise: walking  The 10-year ASCVD risk score Denman George DC Jr., et al., 2013) is: 16.5%  --------------------------------------------------------------------------------------------------- Anxiety, Follow-up  He was last seen for anxiety 1 years ago. Changes made at last visit include no change.   He  reports good compliance with treatment. He reports good tolerance of treatment. He is not having side effects.   He feels his anxiety is severe and Worse since last visit.  Symptoms: No chest pain No difficulty concentrating No dizziness Yes fatigue No feelings of losing control Yes insomnia No irritable No palpitations No panic attacks No racing thoughts No shortness of breath No sweating No tremors/shakes    GAD-7 Results GAD-7 Generalized Anxiety Disorder Screening Tool 07/31/2019  1. Feeling Nervous, Anxious, or on Edge 1  2. Not Being Able to Stop or Control Worrying 3  3. Worrying Too Much About Different Things 3  4. Trouble Relaxing 1  5. Being So Restless it's Hard To Sit Still 0  6. Becoming Easily Annoyed or Irritable 1  7. Feeling Afraid As If Something Awful Might Happen 0  Total GAD-7 Score 9  Difficulty At Work, Home, or Getting  Along With Others? Somewhat difficult    PHQ-9 Scores PHQ9 SCORE ONLY 07/31/2019 07/25/2018 08/17/2017  Score 5 11 7     ---------------------------------------------------------------------------------------------------     Medications: Outpatient Medications Prior to Visit  Medication Sig  . ALPRAZolam (XANAX) 1 MG tablet TAKE 1 TABLET BY MOUTH TWO TO THREE TIMES DAILY AS NEEDED FOR ANXIETY  . Melatonin 10 MG TABS Take by mouth.  . metoprolol succinate (TOPROL-XL) 25 MG 24 hr tablet TAKE 1 TABLET BY MOUTH ONCE DAILY  . valACYclovir (VALTREX) 1000 MG tablet TAKE 2 TABLETS BY MOUTH TWO TIMES DAILY AS DIRECTED  . VENTOLIN  HFA 108 (90 Base) MCG/ACT inhaler INHALE 2 PUFFS BY MOUTH EVERY 6 HOURS AS NEEDED  . Mometasone Furoate (ASMANEX HFA) 100 MCG/ACT AERO Inhale 2 puffs into the lungs 2 (two) times daily. (Patient not taking: Reported on 04/25/2018)  . [DISCONTINUED] atorvastatin (LIPITOR) 80 MG tablet Take 1 tablet (80 mg total) by mouth daily. (Patient not taking: Reported on 07/31/2019)  . [DISCONTINUED] rosuvastatin (CRESTOR) 40  MG tablet Take 1 tablet (40 mg total) by mouth daily.   No facility-administered medications prior to visit.    Review of Systems  Constitutional: Negative for appetite change, chills and fever.  Respiratory: Negative for chest tightness, shortness of breath and wheezing.   Cardiovascular: Negative for chest pain and palpitations.  Gastrointestinal: Negative for abdominal pain, nausea and vomiting.      Objective    BP (!) 147/97 (BP Location: Right Arm, Patient Position: Sitting, Cuff Size: Large)   Pulse 73   Temp (!) 97.1 F (36.2 C) (Temporal)   Ht 5\' 7"  (1.702 m)   Wt 286 lb 12.8 oz (130.1 kg)   BMI 44.92 kg/m    Physical Exam   General: Appearance:    Severely obese male in no acute distress  Eyes:    PERRL, conjunctiva/corneas clear, EOM's intact       Lungs:     Diffusing expiratory wheezine, respirations unlabored  Heart:    Normal heart rate. Normal rhythm. No murmurs, rubs, or gallops.   MS:   All extremities are intact.   Neurologic:   Awake, alert, oriented x 3. No apparent focal neurological           defect.        No results found for any visits on 07/31/19.  Assessment & Plan     1. Essential hypertension Not to goal. See how labs look. Consider increasing metoprolol or adding second agent after reviewing labs.  - Lipid panel - Comprehensive metabolic panel  2. 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   3. Hyperlipidemia, mixed Non-compliant with statins due to his perception that rosuvastatin increased his appetite. Will se how lipids look and consider different statin.   4. Morbid obesity ([place]) He has started walking regularly for exercise.   5. Hyperglycemia  - Hemoglobin A1c  6. Chest pain, unspecified type He has multiple cardiac risk factors and extensively counseled that he as high risk for developing  premature cardiac disease. Strongly encouraged to discontinue smoking. Recommended he take low dose aspirin every day - Exercise Tolerance Test; Future   7. Tobacco abuse Advised of high risk of heart disease and that he is likely developing emphysema based on lung exam.   No follow-ups on file.      The entirety of the information documented in the History of Present Illness, Review of Systems and Physical Exam were personally obtained by me. Portions of this information were initially documented by the CMA and reviewed by me for thoroughness and accuracy.      Lelon Huh, MD  [facility] [phone] (phone) [phone] (fax)  [place]

## 2019-07-31 NOTE — Patient Instructions (Signed)
.   Please review the attached list of medications and notify my office if there are any errors.    I recommend you take a low dose 81mg  coated aspirin to help protect your heart

## 2019-08-01 LAB — COMPREHENSIVE METABOLIC PANEL
ALT: 33 IU/L (ref 0–44)
AST: 27 IU/L (ref 0–40)
Albumin/Globulin Ratio: 2.1 (ref 1.2–2.2)
Albumin: 4.4 g/dL (ref 4.0–5.0)
Alkaline Phosphatase: 74 IU/L (ref 39–117)
BUN/Creatinine Ratio: 13 (ref 9–20)
BUN: 12 mg/dL (ref 6–24)
Bilirubin Total: 0.5 mg/dL (ref 0.0–1.2)
CO2: 20 mmol/L (ref 20–29)
Calcium: 9.2 mg/dL (ref 8.7–10.2)
Chloride: 102 mmol/L (ref 96–106)
Creatinine, Ser: 0.89 mg/dL (ref 0.76–1.27)
GFR calc Af Amer: 123 mL/min/{1.73_m2} (ref >59)
GFR calc non Af Amer: 106 mL/min/{1.73_m2} (ref >59)
Globulin, Total: 2.1 g/dL (ref 1.5–4.5)
Glucose: 95 mg/dL (ref 65–99)
Potassium: 4.2 mmol/L (ref 3.5–5.2)
Sodium: 138 mmol/L (ref 134–144)
Total Protein: 6.5 g/dL (ref 6.0–8.5)

## 2019-08-01 LAB — HEMOGLOBIN A1C
Est. average glucose Bld gHb Est-mCnc: 111 mg/dL
Hgb A1c MFr Bld: 5.5 % (ref 4.8–5.6)

## 2019-08-01 LAB — LIPID PANEL
Chol/HDL Ratio: 6.6 ratio — ABNORMAL HIGH (ref 0.0–5.0)
Cholesterol, Total: 190 mg/dL (ref 100–199)
HDL: 29 mg/dL — ABNORMAL LOW (ref >39)
LDL Chol Calc (NIH): 109 mg/dL — ABNORMAL HIGH (ref 0–99)
Triglycerides: 298 mg/dL — ABNORMAL HIGH (ref 0–149)
VLDL Cholesterol Cal: 52 mg/dL — ABNORMAL HIGH (ref 5–40)

## 2019-08-03 ENCOUNTER — Other Ambulatory Visit: Payer: Self-pay | Admitting: Family Medicine

## 2019-08-03 ENCOUNTER — Other Ambulatory Visit: Payer: Self-pay

## 2019-08-03 DIAGNOSIS — E782 Mixed hyperlipidemia: Secondary | ICD-10-CM

## 2019-08-03 DIAGNOSIS — I10 Essential (primary) hypertension: Secondary | ICD-10-CM

## 2019-08-03 MED ORDER — PRAVASTATIN SODIUM 20 MG PO TABS: 20.0000 mg | ORAL_TABLET | Freq: Every day | ORAL | 3 refills | Status: DC

## 2019-08-03 MED ORDER — METOPROLOL SUCCINATE ER 50 MG PO TB24: 50.0000 mg | ORAL_TABLET | Freq: Every day | ORAL | 3 refills | Status: DC

## 2019-08-03 NOTE — Telephone Encounter (Signed)
-----   Message from [name], MD sent at 08/03/2019  7:49 AM EDT ----- Cholesterol is Ok, but triglycerides are high. Need to try a different medications for triglycerides. Try pravastatin 20mg  one tablet daily, #30, rf x 3.  Need to increase metoprolol succinate to 50mg  once a day #30, rf x 3 for better blood pressure control. Need to schedule follow up in 3 months.

## 2019-08-03 NOTE — Telephone Encounter (Signed)
Attempted to contact patient, no answer left a  voicemail. Okay for [facility] to advise patient and schedule follow up.

## 2019-08-07 ENCOUNTER — Other Ambulatory Visit: Payer: 59

## 2019-08-08 ENCOUNTER — Ambulatory Visit: Payer: 59

## 2019-08-23 ENCOUNTER — Other Ambulatory Visit: Payer: Self-pay | Admitting: Family Medicine

## 2019-08-23 DIAGNOSIS — F411 Generalized anxiety disorder: Secondary | ICD-10-CM

## 2019-08-23 NOTE — Telephone Encounter (Signed)
Requested medication (s) are due for refill today: no  Requested medication (s) are on the active medication list: yes  Last refill:  08/01/2019  Future visit scheduled:no  Notes to clinic:    This refill cannot be delegated     Requested Prescriptions  Pending Prescriptions Disp Refills   ALPRAZolam (XANAX) 1 MG tablet [Pharmacy Med Name: ALPRAZOLAM 1 MG TABS 1 Tablet] 75 tablet 1    Sig: TAKE 1 TABLET BY MOUTH TWO TO THREE TIMES DAILY AS NEEDED FOR ANXIETY      Not Delegated - Psychiatry:  Anxiolytics/Hypnotics Failed - 08/23/2019  7:32 AM      Failed - This refill cannot be delegated      Failed - Urine Drug Screen completed in last 360 days.      Passed - Valid encounter within last 6 months    Recent Outpatient Visits           3 weeks ago Essential hypertension   [facility] [name], MD   1 year ago Sprain of medial collateral ligament of left knee, initial encounter   [facility] [name], MD   1 year ago Anxiety, generalized   [facility] [name], MD   2 years ago Annual physical exam   [facility] [name], MD   2 years ago Hyperlipidemia, mixed   [facility] [name], MD

## 2019-10-02 ENCOUNTER — Other Ambulatory Visit: Payer: Self-pay | Admitting: Family Medicine

## 2019-10-02 DIAGNOSIS — F411 Generalized anxiety disorder: Secondary | ICD-10-CM

## 2019-10-02 NOTE — Telephone Encounter (Signed)
Requested medication (s) are due for refill today: no  Requested medication (s) are on the active medication list: yes  Last refill:  09/14/2019  Future visit scheduled: no  Notes to clinic:  this refill cannot be delegated    Requested Prescriptions  Pending Prescriptions Disp Refills   ALPRAZolam (XANAX) 1 MG tablet [Pharmacy Med Name: ALPRAZOLAM 1 MG TABS 1 Tablet] 75 tablet 1    Sig: TAKE 1 TABLET BY MOUTH TWO TO THREE TIMES DAILY AS NEEDED FOR ANXIETY      Not Delegated - Psychiatry:  Anxiolytics/Hypnotics Failed - 10/02/2019  7:32 AM      Failed - This refill cannot be delegated      Failed - Urine Drug Screen completed in last 360 days.      Passed - Valid encounter within last 6 months    Recent Outpatient Visits           2 months ago Essential hypertension   [facility] [name], MD   1 year ago Sprain of medial collateral ligament of left knee, initial encounter   [facility] [name], MD   1 year ago Anxiety, generalized   [facility] [name], MD   2 years ago Annual physical exam   [facility] [name], MD   2 years ago Hyperlipidemia, mixed   [facility] [name], MD

## 2019-10-09 ENCOUNTER — Other Ambulatory Visit: Payer: Self-pay | Admitting: Family Medicine

## 2019-10-09 DIAGNOSIS — F411 Generalized anxiety disorder: Secondary | ICD-10-CM

## 2019-10-09 NOTE — Telephone Encounter (Signed)
Requested medication (s) are due for refill today: no  Requested medication (s) are on the active medication list:yes  Last refill:  09/14/2019  Future visit scheduled: no  Notes to clinic: this refill cannot be delegated    Requested Prescriptions  Pending Prescriptions Disp Refills   ALPRAZolam (XANAX) 1 MG tablet [Pharmacy Med Name: ALPRAZOLAM 1 MG TABS 1 Tablet] 75 tablet 1    Sig: TAKE 1 TABLET BY MOUTH TWO TO THREE TIMES DAILY AS NEEDED FOR ANXIETY      Not Delegated - Psychiatry:  Anxiolytics/Hypnotics Failed - 10/09/2019  7:36 AM      Failed - This refill cannot be delegated      Failed - Urine Drug Screen completed in last 360 days.      Passed - Valid encounter within last 6 months    Recent Outpatient Visits           2 months ago Essential hypertension   [facility] [name], MD   1 year ago Sprain of medial collateral ligament of left knee, initial encounter   [facility] [name], MD   1 year ago Anxiety, generalized   [facility] [name], MD   2 years ago Annual physical exam   [facility] [name], MD   2 years ago Hyperlipidemia, mixed   [facility] [name], MD

## 2019-10-23 DIAGNOSIS — Z20822 Contact with and (suspected) exposure to covid-19: Secondary | ICD-10-CM | POA: Diagnosis not present

## 2019-10-26 DIAGNOSIS — Z20822 Contact with and (suspected) exposure to covid-19: Secondary | ICD-10-CM | POA: Diagnosis not present

## 2020-01-04 ENCOUNTER — Other Ambulatory Visit: Payer: Self-pay | Admitting: Family Medicine

## 2020-01-04 DIAGNOSIS — E782 Mixed hyperlipidemia: Secondary | ICD-10-CM

## 2020-01-04 DIAGNOSIS — F411 Generalized anxiety disorder: Secondary | ICD-10-CM

## 2020-01-05 ENCOUNTER — Other Ambulatory Visit: Payer: Self-pay | Admitting: Family Medicine

## 2020-01-05 MED ORDER — ALPRAZOLAM 1 MG PO TABS: ORAL_TABLET | ORAL | 1 refills | Status: DC

## 2020-01-05 MED ORDER — PRAVASTATIN SODIUM 20 MG PO TABS: 20.0000 mg | ORAL_TABLET | Freq: Every day | ORAL | 3 refills | Status: DC

## 2020-01-05 MED ORDER — VALACYCLOVIR HCL 1 G PO TABS: ORAL_TABLET | ORAL | 5 refills | Status: DC

## 2020-01-05 NOTE — Addendum Note (Signed)
Addended by: [name] on: 01/05/2020 09:46 AM   Modules accepted: Orders

## 2020-01-16 ENCOUNTER — Encounter: Payer: Self-pay | Admitting: Family Medicine

## 2020-01-18 ENCOUNTER — Other Ambulatory Visit: Payer: Self-pay | Admitting: Family Medicine

## 2020-02-05 ENCOUNTER — Other Ambulatory Visit: Payer: Self-pay | Admitting: Family Medicine

## 2020-02-05 DIAGNOSIS — I10 Essential (primary) hypertension: Secondary | ICD-10-CM

## 2020-02-12 ENCOUNTER — Other Ambulatory Visit: Payer: Self-pay | Admitting: Emergency Medicine

## 2020-03-11 ENCOUNTER — Other Ambulatory Visit: Payer: Self-pay | Admitting: Family Medicine

## 2020-03-11 DIAGNOSIS — I10 Essential (primary) hypertension: Secondary | ICD-10-CM

## 2020-03-11 MED ORDER — METOPROLOL SUCCINATE ER 50 MG PO TB24: ORAL_TABLET | ORAL | 0 refills | Status: DC

## 2020-03-11 NOTE — Telephone Encounter (Signed)
metoprolol succinate (TOPROL-XL) 50 MG 24 hr tablet 30 tablet 0 02/05/2020    Sig: TAKE 1 TABLET BY MOUTH DAILY. TAKE WITH OR IMMEDIATELY FOLLOWING A MEAL.   Sent to pharmacy as: metoprolol succinate (TOPROL-XL) 50 MG 24 hr tablet   Notes to Pharmacy: Schedule follow up appointment   E-Prescribing Status: Receipt confirmed by pharmacy (02/05/2020 7:29 AM EST)    Pt only has 2 pills left. He has an appt on 12/29 which was 1st available. [facility] - [place], [place] - [address]

## 2020-03-19 ENCOUNTER — Other Ambulatory Visit: Payer: Self-pay

## 2020-03-19 ENCOUNTER — Ambulatory Visit (INDEPENDENT_AMBULATORY_CARE_PROVIDER_SITE_OTHER): Payer: 59 | Admitting: Family Medicine

## 2020-03-19 ENCOUNTER — Encounter: Payer: Self-pay | Admitting: Family Medicine

## 2020-03-19 ENCOUNTER — Other Ambulatory Visit: Payer: Self-pay | Admitting: Family Medicine

## 2020-03-19 VITALS — BP 133/83 | HR 67 | Temp 98.4°F | Resp 20 | Wt 283.0 lb

## 2020-03-19 DIAGNOSIS — I10 Essential (primary) hypertension: Secondary | ICD-10-CM

## 2020-03-19 DIAGNOSIS — E782 Mixed hyperlipidemia: Secondary | ICD-10-CM

## 2020-03-19 DIAGNOSIS — F411 Generalized anxiety disorder: Secondary | ICD-10-CM | POA: Diagnosis not present

## 2020-03-19 DIAGNOSIS — Z720 Tobacco use: Secondary | ICD-10-CM

## 2020-03-19 MED ORDER — ALPRAZOLAM 1 MG PO TABS: ORAL_TABLET | ORAL | 1 refills | Status: DC

## 2020-03-19 NOTE — Patient Instructions (Addendum)
.   Please review the attached list of medications and notify my office if there are any errors.   . Please stop smoking  . It is recommended to engage in 150 minutes of moderate exercise every week. Work on getting your weight below 250 pounds.  . Covid-19 vaccines: The Covid vaccines have been given to hundreds of millions of people and found to be very effective and are as safe as any other vaccine.  The [employer] vaccine has been associated with very rare dangerous blood clots, but only in adult women under the age of 47.  The risk of dying from Covid infections is much higher than having a serious reaction to the vaccine.  I strongly recommend getting fully vaccinated against Covid-19.  I recommend that adult women under 60 get fully vaccinated, but the [place] and [employer] vaccines may be safer for those women than the [employer] vaccine.

## 2020-03-19 NOTE — Progress Notes (Signed)
Established patient visit   Patient: Todd Chen   DOB: [birth year]   44 y.o. Male  MRN: [id number] Visit Date: 03/19/2020  Today's healthcare provider: Mila Merry, MD   Chief Complaint  Patient presents with  . Hypertension  . Anxiety  . Hyperlipidemia  . Hyperglycemia   Subjective    HPI  Hypertension, follow-up  BP Readings from Last 3 Encounters:  03/19/20 133/83  07/31/19 (!) 147/97  07/25/18 (!) 150/98   Wt Readings from Last 3 Encounters:  03/19/20 283 lb (128.4 kg)  07/31/19 286 lb 12.8 oz (130.1 kg)  07/25/18 288 lb (130.6 kg)     He was last seen for hypertension 7 months ago.  BP at that visit was 147/97. Management since that visit includes increasing metoprolol succinate to 50mg  once a day.  He reports good compliance with treatment. He is not having side effects.  He is following a Regular diet. He is not exercising. He does smoke.  Use of agents associated with hypertension: none.   Outside blood pressures are checked, 140/110.  Symptoms: No chest pain No chest pressure  No palpitations No syncope  No dyspnea No orthopnea  No paroxysmal nocturnal dyspnea No lower extremity edema   Pertinent labs: Lab Results  Component Value Date   CHOL 190 07/31/2019   HDL 29 (L) 07/31/2019   LDLCALC 109 (H) 07/31/2019   TRIG 298 (H) 07/31/2019   CHOLHDL 6.6 (H) 07/31/2019   Lab Results  Component Value Date   NA 138 07/31/2019   K 4.2 07/31/2019   CREATININE 0.89 07/31/2019   GFRNONAA 106 07/31/2019   GFRAA 123 07/31/2019   GLUCOSE 95 07/31/2019     The 10-year ASCVD risk score 09/30/2019 DC Jr., et al., 2013) is: 10.2%   ---------------------------------------------------------------------------------------------------  Anxiety, Follow-up  He was last seen for anxiety 7 months ago. 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    He reports good compliance with treatment. He reports good tolerance of treatment. He is not having side effects.   He feels his anxiety is mild and Unchanged since last visit.  Symptoms: No chest pain No difficulty concentrating  No dizziness No fatigue  No feelings of losing control No insomnia  No irritable No palpitations  No panic attacks No racing thoughts  No shortness of breath No sweating  No tremors/shakes    GAD-7 Results GAD-7 Generalized Anxiety Disorder Screening Tool 07/31/2019  1. Feeling Nervous, Anxious, or on Edge 1  2. Not Being Able to Stop or Control Worrying 3  3. Worrying Too Much About Different Things 3  4. Trouble Relaxing 1  5. Being So Restless it's Hard To Sit Still 0  6. Becoming Easily Annoyed or Irritable 1  7. Feeling Afraid As If Something Awful Might Happen 0  Total GAD-7 Score 9  Difficulty At Work, Home, or Getting  Along With Others? Somewhat difficult    PHQ-9 Scores PHQ9 SCORE ONLY 03/19/2020 07/31/2019 07/25/2018  PHQ-9 Total Score 6 5 11     ---------------------------------------------------------------------------------------------------  Lipid/Cholesterol, Follow-up  Last lipid panel Other pertinent labs  Lab Results  Component Value Date   CHOL 190 07/31/2019   HDL 29 (L) 07/31/2019   LDLCALC 109 (H) 07/31/2019   TRIG 298 (H) 07/31/2019   CHOLHDL 6.6 (H)  07/31/2019   Lab Results  Component Value Date   ALT 33 07/31/2019   AST 27 07/31/2019   PLT 221 06/27/2017   TSH 0.897 11/24/2016     He was last seen for this 7 months ago.  Management since that visit includes starting pravastatin 20mg  daily.  He reports poor compliance with treatment. He is having side effects. He stopped taking Pravastatin due to it causing increased appetite.  Symptoms: No chest pain No chest pressure/discomfort  No dyspnea No lower extremity edema  No numbness or tingling of extremity No  orthopnea  No palpitations No paroxysmal nocturnal dyspnea  No speech difficulty No syncope   Current diet: in general, a "healthy" diet   Current exercise: none  The 10-year ASCVD risk score DC Jr., et al., 2013) is: 10.2%  ---------------------------------------------------------------------------------------------------  Hyperglycemia, Follow-up  Lab Results  Component Value Date   HGBA1C 5.5 07/31/2019   HGBA1C 5.5 04/25/2018   GLUCOSE 95 07/31/2019   GLUCOSE 117 (H) 01/25/2019   GLUCOSE 89 10/11/2018    Last seen for for this7 months ago.  Management since that visit includes continue healthy diet and regular exercise. Current symptoms include none and have been stable.  Prior visit with dietician: no Current diet: in general, a "healthy" diet   Current exercise: none  Pertinent Labs:    Component Value Date/Time   CHOL 190 07/31/2019 1120   TRIG 298 (H) 07/31/2019 1120   CHOLHDL 6.6 (H) 07/31/2019 1120   CREATININE 0.89 07/31/2019 1120    Wt Readings from Last 3 Encounters:  03/19/20 283 lb (128.4 kg)  07/31/19 286 lb 12.8 oz (130.1 kg)  07/25/18 288 lb (130.6 kg)    -----------------------------------------------------------------------------------------    Medications: Outpatient Medications Prior to Visit  Medication Sig  . albuterol (VENTOLIN HFA) 108 (90 Base) MCG/ACT inhaler INHALE 2 PUFFS BY MOUTH EVERY 6 HOURS AS NEEDED  . ALPRAZolam (XANAX) 1 MG tablet TAKE 1 TABLET BY MOUTH TWO TO THREE TIMES DAILY AS NEEDED FOR ANXIETY  . Melatonin 10 MG TABS Take by mouth.  . metoprolol succinate (TOPROL-XL) 50 MG 24 hr tablet TAKE 1 TABLET BY MOUTH DAILY. TAKE WITH OR IMMEDIATELY FOLLOWING A MEAL.  . Mometasone Furoate (ASMANEX HFA) 100 MCG/ACT AERO Inhale 2 puffs into the lungs 2 (two) times daily.  . valACYclovir (VALTREX) 1000 MG tablet TAKE 2 TABLETS (2GM) BY MOUTH 2 TIMES DAILY AS DIRECTED  . pravastatin (PRAVACHOL) 20 MG tablet Take 1 tablet (20  mg total) by mouth daily. (Patient not taking: Reported on 03/19/2020)   No facility-administered medications prior to visit.    Review of Systems  Constitutional: Negative for appetite change, chills and fever.  Respiratory: Negative for chest tightness, shortness of breath and wheezing.   Cardiovascular: Negative for chest pain and palpitations.  Gastrointestinal: Negative for abdominal pain, nausea and vomiting.      Objective    BP 133/83 (BP Location: Left Arm, Patient Position: Sitting, Cuff Size: Large)   Pulse 67   Temp 98.4 F (36.9 C) (Temporal)   Resp 20   Wt 283 lb (128.4 kg)   BMI 44.32 kg/m   Physical Exam  General appearance: Severely obese male, cooperative and in no acute distress Head: Normocephalic, without obvious abnormality, atraumatic Respiratory: Respirations even and unlabored, normal respiratory rate Extremities: All extremities are intact.  Skin: Skin color, texture, turgor normal. No rashes seen  Psych: Appropriate mood and affect. Neurologic: Mental status: Alert, oriented to person, place, and  time, thought content appropriate.   No results found for any visits on 03/19/20.  Assessment & Plan     1. Essential hypertension Much better controlled. Continue current medications.    2. Hyperlipidemia, mixed He is tolerating pravastatin well with no adverse effects.   - CBC - Comprehensive metabolic panel - Lipid panel  3. Anxiety, generalized intolerant to SSRIs and SNRIs, but doing well with prn - ALPRAZolam (XANAX) 1 MG tablet; TAKE 1 TABLET BY MOUTH TWO TO THREE TIMES DAILY AS NEEDED FOR ANXIETY  Dispense: 75 tablet; Refill: 1  5. Morbid obesity ([facility]) Has been working on diet, but put 9 pounds back on over the last few weeks due to holiday parties. He anticipates getting back on diet and recommend getting weight back below 250 in the next few months.   6. Tobacco abuse Is working on cutting back on cigarettes. Encourage smoking  cessation.    He declined flu vaccine today.       The entirety of the information documented in the History of Present Illness, Review of Systems and Physical Exam were personally obtained by me. Portions of this information were initially documented by the CMA and reviewed by me for thoroughness and accuracy.      Mila Merry, MD  [facility] [phone] (phone) [phone] (fax)  [facility]

## 2020-03-20 LAB — CBC
Hematocrit: 46.6 % (ref 37.5–51.0)
Hemoglobin: 16.4 g/dL (ref 13.0–17.7)
MCH: 33.9 pg — ABNORMAL HIGH (ref 26.6–33.0)
MCHC: 35.2 g/dL (ref 31.5–35.7)
MCV: 96 fL (ref 79–97)
Platelets: 270 10*3/uL (ref 150–450)
RBC: 4.84 x10E6/uL (ref 4.14–5.80)
RDW: 13.1 % (ref 11.6–15.4)
WBC: 12.5 10*3/uL — ABNORMAL HIGH (ref 3.4–10.8)

## 2020-03-20 LAB — COMPREHENSIVE METABOLIC PANEL
ALT: 35 IU/L (ref 0–44)
AST: 40 IU/L (ref 0–40)
Albumin/Globulin Ratio: 2.1 (ref 1.2–2.2)
Albumin: 4.2 g/dL (ref 4.0–5.0)
Alkaline Phosphatase: 76 IU/L (ref 44–121)
BUN/Creatinine Ratio: 14 (ref 9–20)
BUN: 11 mg/dL (ref 6–24)
Bilirubin Total: <0.2 mg/dL (ref 0.0–1.2)
CO2: 19 mmol/L — ABNORMAL LOW (ref 20–29)
Calcium: 9.1 mg/dL (ref 8.7–10.2)
Chloride: 101 mmol/L (ref 96–106)
Creatinine, Ser: 0.76 mg/dL (ref 0.76–1.27)
GFR calc Af Amer: 130 mL/min/{1.73_m2} (ref >59)
GFR calc non Af Amer: 113 mL/min/{1.73_m2} (ref >59)
Globulin, Total: 2 g/dL (ref 1.5–4.5)
Glucose: 115 mg/dL — ABNORMAL HIGH (ref 65–99)
Potassium: 4.3 mmol/L (ref 3.5–5.2)
Sodium: 135 mmol/L (ref 134–144)
Total Protein: 6.2 g/dL (ref 6.0–8.5)

## 2020-03-20 LAB — LIPID PANEL
Chol/HDL Ratio: 9.7 ratio — ABNORMAL HIGH (ref 0.0–5.0)
Cholesterol, Total: 262 mg/dL — ABNORMAL HIGH (ref 100–199)
HDL: 27 mg/dL — ABNORMAL LOW (ref >39)
Triglycerides: 1099 mg/dL (ref 0–149)

## 2020-03-27 ENCOUNTER — Encounter: Payer: Self-pay | Admitting: Family Medicine

## 2020-03-27 DIAGNOSIS — R079 Chest pain, unspecified: Secondary | ICD-10-CM

## 2020-04-08 ENCOUNTER — Other Ambulatory Visit: Payer: Self-pay | Admitting: Family Medicine

## 2020-04-08 DIAGNOSIS — E782 Mixed hyperlipidemia: Secondary | ICD-10-CM

## 2020-04-08 MED ORDER — PRAVASTATIN SODIUM 40 MG PO TABS: 40.0000 mg | ORAL_TABLET | Freq: Every day | ORAL | 3 refills | Status: DC

## 2020-04-09 ENCOUNTER — Telehealth: Payer: Self-pay | Admitting: Family Medicine

## 2020-04-09 NOTE — Telephone Encounter (Signed)
Per Britta Mccreedy at [facility] pt cancelled stress test because he does not want to do the Covid testing that is required before procedure

## 2020-04-11 ENCOUNTER — Ambulatory Visit: Admission: RE | Admit: 2020-04-11 | Payer: 59 | Source: Ambulatory Visit

## 2020-04-14 ENCOUNTER — Other Ambulatory Visit: Payer: Self-pay | Admitting: Family Medicine

## 2020-04-14 DIAGNOSIS — I10 Essential (primary) hypertension: Secondary | ICD-10-CM

## 2020-04-14 NOTE — Telephone Encounter (Signed)
Requested Prescriptions  Pending Prescriptions Disp Refills  . metoprolol succinate (TOPROL-XL) 50 MG 24 hr tablet [Pharmacy Med Name: METOPROLOL SUCCINATE ER 50 50 Tablet] 90 tablet 1    Sig: TAKE 1 TABLET BY MOUTH DAILY. TAKE WITH OR IMMEDIATELY FOLLOWING A MEAL.     Cardiovascular:  Beta Blockers Passed - 04/14/2020  7:50 AM      Passed - Last BP in normal range    BP Readings from Last 1 Encounters:  03/19/20 133/83         Passed - Last Heart Rate in normal range    Pulse Readings from Last 1 Encounters:  03/19/20 67         Passed - Valid encounter within last 6 months    Recent Outpatient Visits          3 weeks ago Essential hypertension   [facility] [name], MD   8 months ago Essential hypertension   [facility] [name], MD   1 year ago Sprain of medial collateral ligament of left knee, initial encounter   [facility] [name], MD   1 year ago Anxiety, generalized   [facility] [name], MD   2 years ago Annual physical exam   [facility] [name], MD

## 2020-05-08 ENCOUNTER — Other Ambulatory Visit: Payer: Self-pay | Admitting: Family Medicine

## 2020-05-08 DIAGNOSIS — F411 Generalized anxiety disorder: Secondary | ICD-10-CM

## 2020-05-08 NOTE — Telephone Encounter (Signed)
Requested medication (s) are due for refill today:  yes  Requested medication (s) are on the active medication list:yes   Last refill:  04/14/2020  Future visit scheduled: no  Notes to clinic:  this refill cannot be delegated    Requested Prescriptions  Pending Prescriptions Disp Refills   ALPRAZolam (XANAX) 1 MG tablet [Pharmacy Med Name: ALPRAZolam 1 MG TABS 1 Tablet] 75 tablet 1    Sig: TAKE 1 TABLET BY MOUTH TWO TO THREE TIMES DAILY AS NEEDED FOR ANXIETY      Not Delegated - Psychiatry:  Anxiolytics/Hypnotics Failed - 05/08/2020  7:44 AM      Failed - This refill cannot be delegated      Failed - Urine Drug Screen completed in last 360 days      Passed - Valid encounter within last 6 months    Recent Outpatient Visits           1 month ago Essential hypertension   [facility] [name], MD   9 months ago Essential hypertension   [facility] [name], MD   1 year ago Sprain of medial collateral ligament of left knee, initial encounter   [facility] [name], MD   2 years ago Anxiety, generalized   [facility] [name], MD   2 years ago Annual physical exam   [facility] [name], MD

## 2020-05-14 ENCOUNTER — Encounter: Payer: Self-pay | Admitting: Family Medicine

## 2020-05-14 ENCOUNTER — Other Ambulatory Visit: Payer: Self-pay

## 2020-05-14 ENCOUNTER — Ambulatory Visit (INDEPENDENT_AMBULATORY_CARE_PROVIDER_SITE_OTHER): Payer: 59 | Admitting: Family Medicine

## 2020-05-14 ENCOUNTER — Other Ambulatory Visit: Payer: Self-pay | Admitting: Family Medicine

## 2020-05-14 VITALS — BP 134/86 | HR 101 | Temp 97.8°F | Resp 16 | Ht 68.0 in | Wt 283.0 lb

## 2020-05-14 DIAGNOSIS — R21 Rash and other nonspecific skin eruption: Secondary | ICD-10-CM | POA: Diagnosis not present

## 2020-05-14 DIAGNOSIS — K219 Gastro-esophageal reflux disease without esophagitis: Secondary | ICD-10-CM | POA: Diagnosis not present

## 2020-05-14 DIAGNOSIS — E782 Mixed hyperlipidemia: Secondary | ICD-10-CM

## 2020-05-14 MED ORDER — SUCRALFATE 1 G PO TABS: ORAL_TABLET | ORAL | 3 refills | Status: DC

## 2020-05-14 NOTE — Patient Instructions (Signed)
.   Please review the attached list of medications and notify my office if there are any errors.   . Please bring all of your medications to every appointment so we can make sure that our medication list is the same as yours.   

## 2020-05-14 NOTE — Progress Notes (Signed)
Established patient visit   Patient: Todd Chen   DOB: 10-21-1975   45 y.o. Male  MRN: [id number] Visit Date: 05/14/2020  Today's healthcare provider: Mila Merry, MD   Chief Complaint  Patient presents with  . Rash   Subjective    HPI  Patient here today C/O rash on groin area and right leg. Patient reports rash turned into abscess and they did drained. Patient reports he started taking valtrex and reports rash has improvement. He thinks it has healed over, but his wife wanted MD to take a look.   He also states he is nearly out of sulcrafate for reflux sx and needs a refill.   He also states he has started taking the pravastatin 40mg  and his wife is now making sure he takes it consistently, and is not having any adverse effects from medication.   Allergies  Allergen Reactions  . Chantix  [Varenicline]     Crazy Dreams  . Wellbutrin Sr  [Bupropion Hcl]     Stomach pains       Medications: Outpatient Medications Prior to Visit  Medication Sig  . albuterol (VENTOLIN HFA) 108 (90 Base) MCG/ACT inhaler INHALE 2 PUFFS BY MOUTH EVERY 6 HOURS AS NEEDED  . ALPRAZolam (XANAX) 1 MG tablet TAKE 1 TABLET BY MOUTH TWO TO THREE TIMES DAILY AS NEEDED FOR ANXIETY (Patient taking differently: at bedtime. TAKE 1 TABLET BY MOUTH TWO TO THREE TIMES DAILY AS NEEDED FOR ANXIETY)  . metoprolol succinate (TOPROL-XL) 50 MG 24 hr tablet TAKE 1 TABLET BY MOUTH DAILY. TAKE WITH OR IMMEDIATELY FOLLOWING A MEAL.  . Mometasone Furoate (ASMANEX HFA) 100 MCG/ACT AERO Inhale 2 puffs into the lungs 2 (two) times daily.  . pravastatin (PRAVACHOL) 40 MG tablet Take 1 tablet (40 mg total) by mouth daily.  . valACYclovir (VALTREX) 1000 MG tablet TAKE 2 TABLETS (2GM) BY MOUTH 2 TIMES DAILY AS DIRECTED  . [DISCONTINUED] Melatonin 10 MG TABS Take by mouth.   No facility-administered medications prior to visit.    Review of Systems  Constitutional: Negative for chills and fatigue.  Respiratory:  Negative for cough, chest tightness and shortness of breath.   Cardiovascular: Negative for chest pain and palpitations.  Skin: Positive for rash.     Objective    BP 134/86 (BP Location: Right Arm, Patient Position: Sitting, Cuff Size: Large)   Pulse (!) 101   Temp 97.8 F (36.6 C) (Temporal)   Resp 16   Ht 5\' 8"  (1.727 m)   Wt 283 lb (128.4 kg)   SpO2 100%   BMI 43.03 kg/m  BP Readings from Last 3 Encounters:  05/14/20 134/86  03/19/20 133/83  07/31/19 (!) 147/97   Wt Readings from Last 3 Encounters:  05/14/20 283 lb (128.4 kg)  03/19/20 283 lb (128.4 kg)  07/31/19 286 lb 12.8 oz (130.1 kg)    Physical Exam  Slightly discolored around right proximal inner thigh and perineum with no swelling, tenderness, or discharge.     Assessment & Plan     1. Rash Has completely resolved, seems to have improved with valacyclovir. Likely had viral exanthem that has cleared. He has plenty of valacyclovir remaining.   2. Gastroesophageal reflux disease, unspecified whether esophagitis present refill - sucralfate (CARAFATE) 1 g tablet; TAKE ONE TABLET FOUR TIMES A DAY WITH MEALS AND AT BEDTIME AS NEEDED  Dispense: 60 tablet; Refill: 3  3. Hyperlipidemia, mixed Doing will and taking pravastatin 40mg  consistently.  Check lipids with office visit in 2 months.         Mila Merry, MD  [facility] [phone] (phone) [phone] (fax)  [facility]

## 2020-06-03 ENCOUNTER — Other Ambulatory Visit: Payer: Self-pay | Admitting: Family Medicine

## 2020-06-03 DIAGNOSIS — F411 Generalized anxiety disorder: Secondary | ICD-10-CM

## 2020-06-03 NOTE — Telephone Encounter (Signed)
Requested medication (s) are due for refill today: yes  Requested medication (s) are on the active medication list: yes  Last refill: 05/08/2020  Future visit scheduled: yes  Notes to clinic:  this refill cannot be delegated    Requested Prescriptions  Pending Prescriptions Disp Refills   ALPRAZolam (XANAX) 1 MG tablet [Pharmacy Med Name: ALPRAZolam 1 MG TABS 1 Tablet] 75 tablet 0    Sig: TAKE 1 TABLET BY MOUTH TWO TO THREE TIMES DAILY AS NEEDED FOR ANXIETY      Not Delegated - Psychiatry:  Anxiolytics/Hypnotics Failed - 06/03/2020  7:21 AM      Failed - This refill cannot be delegated      Failed - Urine Drug Screen completed in last 360 days      Passed - Valid encounter within last 6 months    Recent Outpatient Visits           2 weeks ago Gastroesophageal reflux disease, unspecified whether esophagitis present   [facility] [name], MD   2 months ago Essential hypertension   [facility] [name], MD   10 months ago Essential hypertension   [facility] [name], MD   1 year ago Sprain of medial collateral ligament of left knee, initial encounter   [facility] [name], MD   2 years ago Anxiety, generalized   [facility] [name], [name], MD       Future Appointments             In 1 month [name], [name], MD [facility]

## 2020-06-26 ENCOUNTER — Other Ambulatory Visit: Payer: Self-pay

## 2020-06-26 DIAGNOSIS — S93602A Unspecified sprain of left foot, initial encounter: Secondary | ICD-10-CM | POA: Diagnosis not present

## 2020-06-26 MED FILL — Alprazolam Tab 1 MG: ORAL | 25 days supply | Qty: 75 | Fill #0 | Status: AC

## 2020-06-26 MED FILL — Valacyclovir HCl Tab 1 GM: ORAL | 8 days supply | Qty: 30 | Fill #0 | Status: AC

## 2020-06-27 ENCOUNTER — Other Ambulatory Visit: Payer: Self-pay

## 2020-07-14 ENCOUNTER — Other Ambulatory Visit: Payer: Self-pay

## 2020-07-14 ENCOUNTER — Ambulatory Visit (INDEPENDENT_AMBULATORY_CARE_PROVIDER_SITE_OTHER): Payer: 59 | Admitting: Family Medicine

## 2020-07-14 ENCOUNTER — Encounter: Payer: Self-pay | Admitting: Family Medicine

## 2020-07-14 VITALS — BP 136/93 | HR 73 | Temp 98.1°F | Resp 20 | Wt 287.0 lb

## 2020-07-14 DIAGNOSIS — H9201 Otalgia, right ear: Secondary | ICD-10-CM

## 2020-07-14 DIAGNOSIS — E782 Mixed hyperlipidemia: Secondary | ICD-10-CM | POA: Diagnosis not present

## 2020-07-14 DIAGNOSIS — H9311 Tinnitus, right ear: Secondary | ICD-10-CM | POA: Diagnosis not present

## 2020-07-14 DIAGNOSIS — H6690 Otitis media, unspecified, unspecified ear: Secondary | ICD-10-CM | POA: Diagnosis not present

## 2020-07-14 DIAGNOSIS — F411 Generalized anxiety disorder: Secondary | ICD-10-CM

## 2020-07-14 MED ORDER — CEFDINIR 300 MG PO CAPS
600.0000 mg | ORAL_CAPSULE | Freq: Every day | ORAL | 0 refills | Status: AC
  Filled 2020-07-14: qty 20, 10d supply, fill #0

## 2020-07-14 NOTE — Progress Notes (Signed)
Established patient visit   Patient: Todd Chen   DOB: [birth year]   45 y.o. Male  MRN: [id number] Visit Date: 07/14/2020  Today's healthcare provider: Mila Merry, MD   Chief Complaint  Patient presents with  . Anxiety  . Hyperlipidemia  . Hypertension  . Ear Pain   Subjective    HPI  Anxiety, Follow-up  He was last seen for anxiety on 03/19/2020.  Changes made at last visit include none; continue as needed Xanax. He ran out when he was at the beach recently. He usually only takes it at night, but takes three at time. After two days became very anxious and could sleep until he got back home and started back on medication   He reports good compliance with treatment. He reports good tolerance of treatment. Patient states he takes Xanax at night to help him sleep. He is not having side effects.   He feels his anxiety is moderate and Unchanged since last visit.  Symptoms: No chest pain No difficulty concentrating  No dizziness No fatigue  No feelings of losing control No insomnia  No irritable No palpitations  No panic attacks No racing thoughts  No shortness of breath No sweating  No tremors/shakes    GAD-7 Results GAD-7 Generalized Anxiety Disorder Screening Tool 07/31/2019  1. Feeling Nervous, Anxious, or on Edge 1  2. Not Being Able to Stop or Control Worrying 3  3. Worrying Too Much About Different Things 3  4. Trouble Relaxing 1  5. Being So Restless it's Hard To Sit Still 0  6. Becoming Easily Annoyed or Irritable 1  7. Feeling Afraid As If Something Awful Might Happen 0  Total GAD-7 Score 9  Difficulty At Work, Home, or Getting  Along With Others? Somewhat difficult    PHQ-9 Scores PHQ9 SCORE ONLY 07/14/2020 03/19/2020 07/31/2019  PHQ-9 Total Score 1 6 5     ---------------------------------------------------------------------------------------------------  Lipid/Cholesterol, Follow-up  Last lipid panel Other pertinent labs  Lab Results   Component Value Date   CHOL 262 (H) 03/19/2020   HDL 27 (L) 03/19/2020   LDLCALC Comment (A) 03/19/2020   TRIG 1,099 (HH) 03/19/2020   CHOLHDL 9.7 (H) 03/19/2020   Lab Results  Component Value Date   ALT 35 03/19/2020   AST 40 03/19/2020   PLT 270 03/19/2020   TSH 0.897 11/24/2016     He was last seen for this 2 months ago.  Management since that visit includes continue same medication and to start taking consistently which he had not been doing.   He reports good compliance with treatment. He is not having side effects.   Symptoms: No chest pain No chest pressure/discomfort  No dyspnea No lower extremity edema  No numbness or tingling of extremity No orthopnea  No palpitations No paroxysmal nocturnal dyspnea  No speech difficulty No syncope   Current diet: in general, an "unhealthy" diet Current exercise: none  The 10-year ASCVD risk score 01/24/2017 DC Jr., et al., 2013) is: 20.3%  ---------------------------------------------------------------------------------------------------  Hypertension, follow-up  BP Readings from Last 3 Encounters:  07/14/20 (!) 136/93  05/14/20 134/86  03/19/20 133/83   Wt Readings from Last 3 Encounters:  07/14/20 287 lb (130.2 kg)  05/14/20 283 lb (128.4 kg)  03/19/20 283 lb (128.4 kg)     He was last seen for hypertension 4 months ago.  BP at that visit was 133/83. Management since that visit includes continuing same medications.  He reports good compliance with  treatment. He is not having side effects.  He is following a Regular diet. He is not exercising. He does smoke.  Use of agents associated with hypertension: none.   Outside blood pressures are not checked. Symptoms: No chest pain No chest pressure  No palpitations No syncope  Yes dyspnea No orthopnea  No paroxysmal nocturnal dyspnea No lower extremity edema   Pertinent labs: Lab Results  Component Value Date   CHOL 262 (H) 03/19/2020   HDL 27 (L) 03/19/2020    LDLCALC Comment (A) 03/19/2020   TRIG 1,099 (HH) 03/19/2020   CHOLHDL 9.7 (H) 03/19/2020   Lab Results  Component Value Date   NA 135 03/19/2020   K 4.3 03/19/2020   CREATININE 0.76 03/19/2020   GFRNONAA 113 03/19/2020   GFRAA 130 03/19/2020   GLUCOSE 115 (H) 03/19/2020     The 10-year ASCVD risk score Denman George DC Jr., et al., 2013) is: 20.3%   ---------------------------------------------------------------------------------------------------  Ear pain: Patient complains of right ear pain for the past 5-6 months. Associated symptoms includes tinnitus. He denies any hearing loss. Has a Zpack in November which didn't help      Medications: Outpatient Medications Prior to Visit  Medication Sig  . albuterol (VENTOLIN HFA) 108 (90 Base) MCG/ACT inhaler INHALE 2 PUFFS BY MOUTH EVERY 6 HOURS AS NEEDED  . ALPRAZolam (XANAX) 1 MG tablet TAKE 1 TABLET BY MOUTH TWO TO THREE TIMES DAILY AS NEEDED FOR ANXIETY  . metoprolol succinate (TOPROL-XL) 50 MG 24 hr tablet TAKE 1 TABLET BY MOUTH DAILY. TAKE WITH OR IMMEDIATELY FOLLOWING A MEAL. (Patient taking differently: Take by mouth daily. TAKE WITH OR IMMEDIATELY FOLLOWING A MEAL.)  . Mometasone Furoate (ASMANEX HFA) 100 MCG/ACT AERO Inhale 2 puffs into the lungs 2 (two) times daily.  . pravastatin (PRAVACHOL) 40 MG tablet TAKE 1 TABLET BY MOUTH DAILY.  Marland Kitchen sucralfate (CARAFATE) 1 g tablet TAKE ONE TABLET BY MOUTH FOUR TIMES A DAY WITH MEALS AND AT BEDTIME AS NEEDED  . valACYclovir (VALTREX) 1000 MG tablet TAKE 2 TABLETS (2GM) BY MOUTH 2 TIMES DAILY AS DIRECTED  . [DISCONTINUED] azithromycin (ZITHROMAX) 250 MG tablet TAKE 2 TABLETS BY MOUTH ON DAY 1 THEN TAKE 1 TABLET DAILY FOR THE NEXT 4 DAYS (Patient not taking: Reported on 07/14/2020)   No facility-administered medications prior to visit.    Review of Systems  Constitutional: Negative for appetite change, chills and fever.  HENT: Positive for ear pain (right ear) and tinnitus (right ear x 2  months).   Respiratory: Positive for shortness of breath. Negative for chest tightness and wheezing.   Cardiovascular: Negative for chest pain and palpitations.  Gastrointestinal: Negative for abdominal pain, nausea and vomiting.       Objective    BP (!) 136/93 (BP Location: Left Arm, Patient Position: Sitting, Cuff Size: Large)   Pulse 73   Temp 98.1 F (36.7 C) (Temporal)   Resp 20   Wt 287 lb (130.2 kg)   SpO2 96% Comment: room air  BMI 43.64 kg/m     Physical Exam   General: Appearance:    Obese male in no acute distress  Eyes:    PERRL, conjunctiva/corneas clear, EOM's intact       ENT   Right TM inflamed  Lungs:     Clear to auscultation bilaterally, respirations unlabored  Heart:    Normal heart rate. Normal rhythm. No murmurs, rubs, or gallops.   MS:   All extremities are intact.   Neurologic:  Awake, alert, oriented x 3. No apparent focal neurological           defect.          Assessment & Plan     1. Chronic otitis media, unspecified otitis media type persistent for several months, no improvement with azithromycin prescribed in November. Start- cefdinir (OMNICEF) 300 MG capsule; Take 2 capsules (600 mg total) by mouth daily for 10 days.  Dispense: 20 capsule; Refill: 0  Is established pt with Dr. Andee Poles. He will call for referral if not rapidly improving on antibiotic.    2. Tinnitus of right ear Is established pt with Dr. Andee Poles. He will call for referral if not rapidly improving on antibiotic.   3. Right ear pain Is established pt with Dr. Andee Poles. He will call for referral if not rapidly improving on antibiotic.   4. Hyperlipidemia, mixed Is now taking pravastatin consistently and tolerating well.   - Comprehensive metabolic panel - Lipid panel  5. Anxiety, generalized Doing well taking 3 alprazolam daily, usually just in the evenings.         The entirety of the information documented in the History of Present Illness, Review of Systems and  Physical Exam were personally obtained by me. Portions of this information were initially documented by the CMA and reviewed by me for thoroughness and accuracy.      Mila Merry, MD  [facility] [phone] (phone) [phone] (fax)  [facility]

## 2020-07-15 LAB — COMPREHENSIVE METABOLIC PANEL
ALT: 28 IU/L (ref 0–44)
AST: 21 IU/L (ref 0–40)
Albumin/Globulin Ratio: 1.9 (ref 1.2–2.2)
Albumin: 4.4 g/dL (ref 4.0–5.0)
Alkaline Phosphatase: 77 IU/L (ref 44–121)
BUN/Creatinine Ratio: 11 (ref 9–20)
BUN: 11 mg/dL (ref 6–24)
Bilirubin Total: <0.2 mg/dL (ref 0.0–1.2)
CO2: 17 mmol/L — ABNORMAL LOW (ref 20–29)
Calcium: 9.2 mg/dL (ref 8.7–10.2)
Chloride: 102 mmol/L (ref 96–106)
Creatinine, Ser: 0.98 mg/dL (ref 0.76–1.27)
Globulin, Total: 2.3 g/dL (ref 1.5–4.5)
Glucose: 129 mg/dL — ABNORMAL HIGH (ref 65–99)
Potassium: 4 mmol/L (ref 3.5–5.2)
Sodium: 141 mmol/L (ref 134–144)
Total Protein: 6.7 g/dL (ref 6.0–8.5)
eGFR: 99 mL/min/{1.73_m2} (ref >59)

## 2020-07-15 LAB — LIPID PANEL
Chol/HDL Ratio: 6 ratio — ABNORMAL HIGH (ref 0.0–5.0)
Cholesterol, Total: 197 mg/dL (ref 100–199)
HDL: 33 mg/dL — ABNORMAL LOW (ref >39)
LDL Chol Calc (NIH): 90 mg/dL (ref 0–99)
Triglycerides: 451 mg/dL — ABNORMAL HIGH (ref 0–149)
VLDL Cholesterol Cal: 74 mg/dL — ABNORMAL HIGH (ref 5–40)

## 2020-07-16 ENCOUNTER — Other Ambulatory Visit: Payer: Self-pay

## 2020-07-16 MED FILL — Pravastatin Sodium Tab 40 MG: ORAL | 90 days supply | Qty: 90 | Fill #0 | Status: AC

## 2020-07-16 MED FILL — Albuterol Sulfate Inhal Aero 108 MCG/ACT (90MCG Base Equiv): RESPIRATORY_TRACT | 30 days supply | Qty: 18 | Fill #0 | Status: AC

## 2020-07-16 MED FILL — Metoprolol Succinate Tab ER 24HR 50 MG (Tartrate Equiv): ORAL | 90 days supply | Qty: 90 | Fill #0 | Status: AC

## 2020-07-21 ENCOUNTER — Encounter: Payer: Self-pay | Admitting: Family Medicine

## 2020-07-21 DIAGNOSIS — H9311 Tinnitus, right ear: Secondary | ICD-10-CM

## 2020-07-21 DIAGNOSIS — H9201 Otalgia, right ear: Secondary | ICD-10-CM

## 2020-07-21 MED FILL — Valacyclovir HCl Tab 1 GM: ORAL | 8 days supply | Qty: 30 | Fill #1 | Status: AC

## 2020-07-21 MED FILL — Alprazolam Tab 1 MG: ORAL | 25 days supply | Qty: 75 | Fill #1 | Status: AC

## 2020-07-22 ENCOUNTER — Other Ambulatory Visit: Payer: Self-pay

## 2020-08-12 ENCOUNTER — Other Ambulatory Visit: Payer: Self-pay

## 2020-08-12 ENCOUNTER — Other Ambulatory Visit: Payer: Self-pay | Admitting: Otolaryngology

## 2020-08-12 ENCOUNTER — Other Ambulatory Visit (HOSPITAL_COMMUNITY): Payer: Self-pay | Admitting: Otolaryngology

## 2020-08-12 DIAGNOSIS — H9311 Tinnitus, right ear: Secondary | ICD-10-CM

## 2020-08-12 DIAGNOSIS — J301 Allergic rhinitis due to pollen: Secondary | ICD-10-CM | POA: Diagnosis not present

## 2020-08-12 DIAGNOSIS — K219 Gastro-esophageal reflux disease without esophagitis: Secondary | ICD-10-CM | POA: Diagnosis not present

## 2020-08-12 DIAGNOSIS — R1314 Dysphagia, pharyngoesophageal phase: Secondary | ICD-10-CM | POA: Diagnosis not present

## 2020-08-12 MED ORDER — TRIAMCINOLONE ACETONIDE 55 MCG/ACT NA AERO
INHALATION_SPRAY | NASAL | 11 refills | Status: DC
  Filled 2020-10-03: qty 32.4, 30d supply, fill #0
  Filled 2020-11-19: qty 32.4, 60d supply, fill #0
  Filled 2020-12-09: qty 32.4, fill #0
  Filled 2021-01-21: qty 32.4, 60d supply, fill #0
  Filled 2021-04-12: qty 32.4, 30d supply, fill #0
  Filled 2021-07-16: qty 32.4, fill #0

## 2020-08-12 MED ORDER — OMEPRAZOLE 40 MG PO CPDR
DELAYED_RELEASE_CAPSULE | ORAL | 3 refills | Status: DC
  Filled 2020-08-12: qty 90, 90d supply, fill #0
  Filled 2020-10-03 – 2020-10-25 (×2): qty 90, 90d supply, fill #1

## 2020-08-13 ENCOUNTER — Other Ambulatory Visit: Payer: Self-pay

## 2020-08-13 ENCOUNTER — Other Ambulatory Visit: Payer: Self-pay | Admitting: Family Medicine

## 2020-08-13 DIAGNOSIS — I10 Essential (primary) hypertension: Secondary | ICD-10-CM

## 2020-08-13 DIAGNOSIS — F411 Generalized anxiety disorder: Secondary | ICD-10-CM

## 2020-08-13 MED FILL — Alprazolam Tab 1 MG: ORAL | 25 days supply | Qty: 75 | Fill #0 | Status: AC

## 2020-08-13 MED FILL — Valacyclovir HCl Tab 1 GM: ORAL | 7 days supply | Qty: 30 | Fill #0 | Status: AC

## 2020-08-13 MED FILL — Pravastatin Sodium Tab 40 MG: ORAL | 90 days supply | Qty: 90 | Fill #1 | Status: CN

## 2020-08-13 NOTE — Telephone Encounter (Signed)
Requested medication (s) are due for refill today yes  Requested medication (s) are on the active medication list -yes  Future visit scheduled -no  Last refill: 06/03/20 #75 2 RF  Notes to clinic: Request non delegated Rx- possible duplicate request  Requested Prescriptions  Pending Prescriptions Disp Refills   ALPRAZolam (XANAX) 1 MG tablet 75 tablet 2    Sig: TAKE 1 TABLET BY MOUTH TWO TO THREE TIMES DAILY AS NEEDED FOR ANXIETY      Not Delegated - Psychiatry:  Anxiolytics/Hypnotics Failed - 08/13/2020 10:40 AM      Failed - This refill cannot be delegated      Failed - Urine Drug Screen completed in last 360 days      Passed - Valid encounter within last 6 months    Recent Outpatient Visits           1 month ago Chronic otitis media, unspecified otitis media type   [facility] [name], MD   3 months ago Gastroesophageal reflux disease, unspecified whether esophagitis present   [facility] [name], MD   4 months ago Essential hypertension   [facility] [name], MD   1 year ago Essential hypertension   [facility] [name], MD   2 years ago Sprain of medial collateral ligament of left knee, initial encounter   [facility] [name], MD                 Signed Prescriptions Disp Refills   valACYclovir (VALTREX) 1000 MG tablet 30 tablet 5    Sig: TAKE 2 TABLETS (2GM) BY MOUTH 2 TIMES DAILY AS DIRECTED      Antimicrobials:  Antiviral Agents - Anti-Herpetic Passed - 08/13/2020 10:40 AM      Passed - Valid encounter within last 12 months    Recent Outpatient Visits           1 month ago Chronic otitis media, unspecified otitis media type   [facility] [name], MD   3 months ago Gastroesophageal reflux disease, unspecified whether esophagitis present   [facility] [name], MD   4 months ago Essential  hypertension   [facility] [name], MD   1 year ago Essential hypertension   [facility] [name], MD   2 years ago Sprain of medial collateral ligament of left knee, initial encounter   [facility] [name], MD                 Refused Prescriptions Disp Refills   metoprolol succinate (TOPROL-XL) 50 MG 24 hr tablet 90 tablet 1    Sig: TAKE 1 TABLET BY MOUTH DAILY. TAKE WITH OR IMMEDIATELY FOLLOWING A MEAL.      Cardiovascular:  Beta Blockers Failed - 08/13/2020 10:40 AM      Failed - Last BP in normal range    BP Readings from Last 1 Encounters:  07/14/20 (!) 136/93          Passed - Last Heart Rate in normal range    Pulse Readings from Last 1 Encounters:  07/14/20 73          Passed - Valid encounter within last 6 months    Recent Outpatient Visits           1 month ago Chronic otitis media, unspecified otitis media type   [facility]  [name], MD   3 months ago Gastroesophageal reflux disease, unspecified whether esophagitis present   [facility] [name], MD   4 months ago Essential hypertension   [facility] [name], MD   1 year ago Essential hypertension   [facility] [name], MD   2 years ago Sprain of medial collateral ligament of left knee, initial encounter   [facility] [name], MD                    Requested Prescriptions  Pending Prescriptions Disp Refills   ALPRAZolam (XANAX) 1 MG tablet 75 tablet 2    Sig: TAKE 1 TABLET BY MOUTH TWO TO THREE TIMES DAILY AS NEEDED FOR ANXIETY      Not Delegated - Psychiatry:  Anxiolytics/Hypnotics Failed - 08/13/2020 10:40 AM      Failed - This refill cannot be delegated      Failed - Urine Drug Screen completed in last 360 days      Passed - Valid encounter within last 6 months    Recent Outpatient Visits            1 month ago Chronic otitis media, unspecified otitis media type   [facility] [name], MD   3 months ago Gastroesophageal reflux disease, unspecified whether esophagitis present   [facility] [name], MD   4 months ago Essential hypertension   [facility] [name], MD   1 year ago Essential hypertension   [facility] [name], MD   2 years ago Sprain of medial collateral ligament of left knee, initial encounter   [facility] [name], MD                 Signed Prescriptions Disp Refills   valACYclovir (VALTREX) 1000 MG tablet 30 tablet 5    Sig: TAKE 2 TABLETS (2GM) BY MOUTH 2 TIMES DAILY AS DIRECTED      Antimicrobials:  Antiviral Agents - Anti-Herpetic Passed - 08/13/2020 10:40 AM      Passed - Valid encounter within last 12 months    Recent Outpatient Visits           1 month ago Chronic otitis media, unspecified otitis media type   [facility] [name], MD   3 months ago Gastroesophageal reflux disease, unspecified whether esophagitis present   [facility] [name], MD   4 months ago Essential hypertension   [facility] [name], MD   1 year ago Essential hypertension   [facility] [name], MD   2 years ago Sprain of medial collateral ligament of left knee, initial encounter   [facility] [name], MD                 Refused Prescriptions Disp Refills   metoprolol succinate (TOPROL-XL) 50 MG 24 hr tablet 90 tablet 1    Sig: TAKE 1 TABLET BY MOUTH DAILY. TAKE WITH OR IMMEDIATELY FOLLOWING A MEAL.      Cardiovascular:  Beta Blockers Failed - 08/13/2020 10:40 AM      Failed - Last BP in normal range    BP Readings from Last 1 Encounters:  07/14/20 (!) 136/93          Passed - Last Heart Rate in normal range    Pulse  Readings  from Last 1 Encounters:  07/14/20 73          Passed - Valid encounter within last 6 months    Recent Outpatient Visits           1 month ago Chronic otitis media, unspecified otitis media type   [facility] [name], MD   3 months ago Gastroesophageal reflux disease, unspecified whether esophagitis present   [facility] [name], MD   4 months ago Essential hypertension   [facility] [name], MD   1 year ago Essential hypertension   [facility] [name], MD   2 years ago Sprain of medial collateral ligament of left knee, initial encounter   [facility] [name], MD

## 2020-08-14 ENCOUNTER — Other Ambulatory Visit: Payer: Self-pay

## 2020-08-15 ENCOUNTER — Other Ambulatory Visit: Payer: Self-pay

## 2020-08-15 MED FILL — Sucralfate Tab 1 GM: ORAL | 15 days supply | Qty: 60 | Fill #0 | Status: AC

## 2020-08-21 ENCOUNTER — Ambulatory Visit: Admission: RE | Admit: 2020-08-21 | Payer: 59 | Source: Ambulatory Visit

## 2020-09-01 ENCOUNTER — Other Ambulatory Visit: Payer: Self-pay | Admitting: Family Medicine

## 2020-09-01 DIAGNOSIS — F411 Generalized anxiety disorder: Secondary | ICD-10-CM

## 2020-09-01 MED FILL — Alprazolam Tab 1 MG: ORAL | 25 days supply | Qty: 75 | Fill #1 | Status: CN

## 2020-09-01 NOTE — Telephone Encounter (Signed)
Medication: Rx #: [id number] ALPRAZolam Prudy Feeler) 1 MG tablet [[id number]] Pt is traveling out of town tomorrow and is requesting a refill early.   Has the patient contacted their pharmacy? YES  (Agent: If no, request that the patient contact the pharmacy for the refill.) (Agent: If yes, when and what did the pharmacy advise?)  Preferred Pharmacy (with phone number or street name): [facility] [address] [place] [address] Phone: [phone] Fax: [phone] Hours: M-F 7:30a-5:30p    Agent: Please be advised that RX refills may take up to 3 business days. We ask that you follow-up with your pharmacy.

## 2020-09-02 ENCOUNTER — Other Ambulatory Visit: Payer: Self-pay

## 2020-09-02 MED ORDER — ALPRAZOLAM 1 MG PO TABS
ORAL_TABLET | ORAL | 3 refills | Status: DC
  Filled 2020-09-02: qty 75, fill #0
  Filled 2020-09-09: qty 75, 25d supply, fill #0
  Filled 2020-10-03: qty 75, 25d supply, fill #1
  Filled 2020-10-25: qty 75, 25d supply, fill #2
  Filled 2020-11-19: qty 75, 25d supply, fill #3

## 2020-09-09 ENCOUNTER — Other Ambulatory Visit: Payer: Self-pay

## 2020-09-15 ENCOUNTER — Telehealth: Payer: Self-pay

## 2020-09-15 NOTE — Telephone Encounter (Signed)
Copied from CRM [id number]. Topic: General - Other >> Sep 15, 2020 10:45 AM Jaquita Rector A wrote: Reason for CRM: Patient called in asking to speak to Dr Esmond Camper nurse saying that he has an abscess and all he need is antibiotics but he was informed the Dr is out and he may need to be seen to be treated Please call him at Ph#  [phone]

## 2020-10-03 ENCOUNTER — Other Ambulatory Visit: Payer: Self-pay

## 2020-10-03 MED FILL — Valacyclovir HCl Tab 1 GM: ORAL | 7 days supply | Qty: 30 | Fill #1 | Status: AC

## 2020-10-06 ENCOUNTER — Other Ambulatory Visit: Payer: Self-pay

## 2020-10-23 ENCOUNTER — Other Ambulatory Visit: Payer: Self-pay | Admitting: Family Medicine

## 2020-10-23 DIAGNOSIS — I10 Essential (primary) hypertension: Secondary | ICD-10-CM

## 2020-10-23 MED ORDER — METOPROLOL SUCCINATE ER 50 MG PO TB24
ORAL_TABLET | Freq: Every day | ORAL | 0 refills | Status: DC
Start: 2020-10-23 — End: 2020-12-09
  Filled 2020-10-23: qty 90, 90d supply, fill #0

## 2020-10-24 ENCOUNTER — Other Ambulatory Visit: Payer: Self-pay

## 2020-10-25 MED FILL — Valacyclovir HCl Tab 1 GM: ORAL | 7 days supply | Qty: 30 | Fill #2 | Status: AC

## 2020-10-25 MED FILL — Pravastatin Sodium Tab 40 MG: ORAL | 90 days supply | Qty: 90 | Fill #1 | Status: AC

## 2020-10-25 MED FILL — Sucralfate Tab 1 GM: ORAL | 15 days supply | Qty: 60 | Fill #1 | Status: AC

## 2020-10-27 ENCOUNTER — Other Ambulatory Visit: Payer: Self-pay

## 2020-11-19 MED FILL — Valacyclovir HCl Tab 1 GM: ORAL | 7 days supply | Qty: 30 | Fill #3 | Status: AC

## 2020-11-20 ENCOUNTER — Other Ambulatory Visit: Payer: Self-pay

## 2020-11-25 ENCOUNTER — Other Ambulatory Visit: Payer: Self-pay

## 2020-11-25 ENCOUNTER — Ambulatory Visit
Admission: RE | Admit: 2020-11-25 | Discharge: 2020-11-25 | Disposition: A | Discharge status: Home / Self Care | Payer: 59 | Source: Ambulatory Visit | Attending: Otolaryngology | Admitting: Otolaryngology

## 2020-11-25 DIAGNOSIS — H9311 Tinnitus, right ear: Secondary | ICD-10-CM | POA: Diagnosis not present

## 2020-11-25 DIAGNOSIS — R519 Headache, unspecified: Secondary | ICD-10-CM | POA: Diagnosis not present

## 2020-11-25 DIAGNOSIS — H9319 Tinnitus, unspecified ear: Secondary | ICD-10-CM | POA: Diagnosis not present

## 2020-11-25 DIAGNOSIS — G319 Degenerative disease of nervous system, unspecified: Secondary | ICD-10-CM | POA: Diagnosis not present

## 2020-11-25 MED ORDER — GADOBUTROL 1 MMOL/ML IV SOLN
10.0000 mL | Freq: Once | INTRAVENOUS | Status: AC | PRN
  Administered 2020-11-25: 10 mL via INTRAVENOUS

## 2020-11-26 ENCOUNTER — Encounter: Payer: Self-pay | Admitting: Family Medicine

## 2020-12-09 ENCOUNTER — Other Ambulatory Visit: Payer: Self-pay | Admitting: Family Medicine

## 2020-12-09 DIAGNOSIS — I10 Essential (primary) hypertension: Secondary | ICD-10-CM

## 2020-12-09 DIAGNOSIS — F411 Generalized anxiety disorder: Secondary | ICD-10-CM

## 2020-12-09 MED ORDER — METOPROLOL SUCCINATE ER 50 MG PO TB24
ORAL_TABLET | Freq: Every day | ORAL | 0 refills | Status: DC
  Filled 2020-12-09 – 2021-01-21 (×2): qty 90, 90d supply, fill #0

## 2020-12-09 MED FILL — Valacyclovir HCl Tab 1 GM: ORAL | 7 days supply | Qty: 30 | Fill #4 | Status: CN

## 2020-12-09 NOTE — Telephone Encounter (Signed)
Patient will need an office visit for further refills  

## 2020-12-09 NOTE — Telephone Encounter (Signed)
Requested medications are due for refill today.  yes  Requested medications are on the active medications list.  yes  Last refill. 09/02/2020  Future visit scheduled.   no  Notes to clinic.  Medication is not delegated.

## 2020-12-10 ENCOUNTER — Other Ambulatory Visit: Payer: Self-pay

## 2020-12-10 MED ORDER — ALPRAZOLAM 1 MG PO TABS
ORAL_TABLET | ORAL | 3 refills | Status: DC
  Filled 2020-12-10: qty 75, fill #0
  Filled 2020-12-17: qty 75, 25d supply, fill #0
  Filled 2021-01-05: qty 75, 25d supply, fill #1
  Filled 2021-01-24 – 2021-02-02 (×3): qty 75, 25d supply, fill #2
  Filled 2021-03-02: qty 75, 25d supply, fill #3

## 2020-12-10 MED FILL — Valacyclovir HCl Tab 1 GM: ORAL | 7 days supply | Qty: 30 | Fill #4 | Status: CN

## 2020-12-10 MED FILL — Valacyclovir HCl Tab 1 GM: ORAL | 7 days supply | Qty: 30 | Fill #4 | Status: AC

## 2020-12-17 ENCOUNTER — Other Ambulatory Visit: Payer: Self-pay | Admitting: Family Medicine

## 2020-12-17 ENCOUNTER — Other Ambulatory Visit: Payer: Self-pay

## 2020-12-17 MED ORDER — ALBUTEROL SULFATE HFA 108 (90 BASE) MCG/ACT IN AERS
2.0000 | INHALATION_SPRAY | Freq: Four times a day (QID) | RESPIRATORY_TRACT | 0 refills | Status: DC | PRN
  Filled 2020-12-17: qty 18, 25d supply, fill #0

## 2020-12-17 MED FILL — Sucralfate Tab 1 GM: ORAL | 15 days supply | Qty: 60 | Fill #2 | Status: AC

## 2021-01-05 ENCOUNTER — Other Ambulatory Visit: Payer: Self-pay

## 2021-01-06 ENCOUNTER — Other Ambulatory Visit: Payer: Self-pay

## 2021-01-21 ENCOUNTER — Other Ambulatory Visit: Payer: Self-pay | Admitting: Family Medicine

## 2021-01-21 ENCOUNTER — Other Ambulatory Visit: Payer: Self-pay

## 2021-01-21 MED ORDER — ALBUTEROL SULFATE HFA 108 (90 BASE) MCG/ACT IN AERS
2.0000 | INHALATION_SPRAY | Freq: Four times a day (QID) | RESPIRATORY_TRACT | 3 refills | Status: DC | PRN
Start: 2021-01-21 — End: 2022-01-29
  Filled 2021-01-21: qty 18, 25d supply, fill #0
  Filled 2021-03-02: qty 18, 25d supply, fill #1
  Filled 2021-06-03: qty 18, 25d supply, fill #2
  Filled 2021-07-20: qty 6.7, 30d supply, fill #3
  Filled 2022-01-03: qty 6.7, 30d supply, fill #4

## 2021-01-21 MED FILL — Valacyclovir HCl Tab 1 GM: ORAL | 7 days supply | Qty: 30 | Fill #5 | Status: AC

## 2021-01-21 NOTE — Telephone Encounter (Signed)
Requested Prescriptions  Pending Prescriptions Disp Refills  . albuterol (VENTOLIN HFA) 108 (90 Base) MCG/ACT inhaler 18 g 3    Sig: INHALE 2 PUFFS BY MOUTH EVERY 6 HOURS AS NEEDED     Pulmonology:  Beta Agonists Failed - 01/21/2021  9:53 AM      Failed - One inhaler should last at least one month. If the patient is requesting refills earlier, contact the patient to check for uncontrolled symptoms.      Passed - Valid encounter within last 12 months    Recent Outpatient Visits          6 months ago Chronic otitis media, unspecified otitis media type   [facility] [name], MD   8 months ago Gastroesophageal reflux disease, unspecified whether esophagitis present   [facility] [name], MD   10 months ago Essential hypertension   [facility] [name], MD   1 year ago Essential hypertension   [facility] [name], MD   2 years ago Sprain of medial collateral ligament of left knee, initial encounter   [facility] [name], MD

## 2021-01-26 ENCOUNTER — Other Ambulatory Visit: Payer: Self-pay

## 2021-01-26 ENCOUNTER — Other Ambulatory Visit: Payer: Self-pay | Admitting: Family Medicine

## 2021-01-26 DIAGNOSIS — K219 Gastro-esophageal reflux disease without esophagitis: Secondary | ICD-10-CM

## 2021-01-26 MED ORDER — SUCRALFATE 1 G PO TABS
ORAL_TABLET | ORAL | 3 refills | Status: DC
  Filled 2021-01-26: qty 60, 15d supply, fill #0
  Filled 2021-08-17: qty 60, 15d supply, fill #1
  Filled 2021-09-14: qty 60, 15d supply, fill #2
  Filled 2021-09-24: qty 60, 15d supply, fill #3

## 2021-01-26 MED ORDER — VALACYCLOVIR HCL 1 G PO TABS
ORAL_TABLET | ORAL | 5 refills | Status: DC
  Filled 2021-01-26: qty 30, 7d supply, fill #0
  Filled 2021-03-02: qty 30, 7d supply, fill #1
  Filled 2021-04-12: qty 30, 7d supply, fill #2
  Filled 2021-06-03: qty 30, 7d supply, fill #3
  Filled 2021-06-12 – 2021-06-26 (×2): qty 30, 7d supply, fill #4
  Filled 2021-07-16: qty 30, 7d supply, fill #5

## 2021-01-27 ENCOUNTER — Other Ambulatory Visit: Payer: Self-pay

## 2021-02-02 ENCOUNTER — Other Ambulatory Visit: Payer: Self-pay

## 2021-03-02 ENCOUNTER — Other Ambulatory Visit: Payer: Self-pay

## 2021-03-24 ENCOUNTER — Other Ambulatory Visit: Payer: Self-pay | Admitting: Family Medicine

## 2021-03-24 DIAGNOSIS — F411 Generalized anxiety disorder: Secondary | ICD-10-CM

## 2021-03-25 ENCOUNTER — Other Ambulatory Visit: Payer: Self-pay

## 2021-03-25 MED ORDER — ALPRAZOLAM 1 MG PO TABS
ORAL_TABLET | ORAL | 3 refills | Status: DC
  Filled 2021-03-25: qty 75, 25d supply, fill #0
  Filled 2021-04-12 – 2021-04-17 (×3): qty 75, 25d supply, fill #1
  Filled 2021-05-11: qty 75, 25d supply, fill #2
  Filled 2021-06-03: qty 75, 25d supply, fill #3

## 2021-04-13 ENCOUNTER — Other Ambulatory Visit: Payer: Self-pay

## 2021-04-14 ENCOUNTER — Other Ambulatory Visit: Payer: Self-pay

## 2021-04-15 ENCOUNTER — Other Ambulatory Visit: Payer: Self-pay | Admitting: Family Medicine

## 2021-04-15 ENCOUNTER — Other Ambulatory Visit: Payer: Self-pay

## 2021-04-15 DIAGNOSIS — I10 Essential (primary) hypertension: Secondary | ICD-10-CM

## 2021-04-15 MED ORDER — METOPROLOL SUCCINATE ER 50 MG PO TB24
ORAL_TABLET | Freq: Every day | ORAL | 0 refills | Status: DC
  Filled 2021-04-15: qty 90, 90d supply, fill #0

## 2021-04-17 ENCOUNTER — Other Ambulatory Visit: Payer: Self-pay

## 2021-05-11 ENCOUNTER — Other Ambulatory Visit: Payer: Self-pay

## 2021-06-03 ENCOUNTER — Other Ambulatory Visit: Payer: Self-pay

## 2021-06-12 ENCOUNTER — Other Ambulatory Visit: Payer: Self-pay | Admitting: Family Medicine

## 2021-06-12 ENCOUNTER — Other Ambulatory Visit: Payer: Self-pay

## 2021-06-12 DIAGNOSIS — I10 Essential (primary) hypertension: Secondary | ICD-10-CM

## 2021-06-12 DIAGNOSIS — F411 Generalized anxiety disorder: Secondary | ICD-10-CM

## 2021-06-13 MED FILL — Alprazolam Tab 1 MG: ORAL | Qty: 75 | Fill #0 | Status: CN

## 2021-06-13 MED FILL — Metoprolol Succinate Tab ER 24HR 50 MG (Tartrate Equiv): ORAL | 30 days supply | Qty: 30 | Fill #0 | Status: CN

## 2021-06-15 ENCOUNTER — Other Ambulatory Visit: Payer: Self-pay

## 2021-06-22 MED FILL — Alprazolam Tab 1 MG: ORAL | 25 days supply | Qty: 75 | Fill #0 | Status: CN

## 2021-06-23 ENCOUNTER — Other Ambulatory Visit: Payer: Self-pay

## 2021-06-23 MED FILL — Alprazolam Tab 1 MG: ORAL | Qty: 75 | Fill #0 | Status: CN

## 2021-06-24 ENCOUNTER — Other Ambulatory Visit: Payer: Self-pay

## 2021-06-25 ENCOUNTER — Other Ambulatory Visit: Payer: Self-pay

## 2021-06-26 ENCOUNTER — Other Ambulatory Visit: Payer: Self-pay

## 2021-06-26 MED FILL — Alprazolam Tab 1 MG: ORAL | 25 days supply | Qty: 75 | Fill #0 | Status: CN

## 2021-06-26 MED FILL — Alprazolam Tab 1 MG: ORAL | 25 days supply | Qty: 75 | Fill #0 | Status: AC

## 2021-06-26 MED FILL — Metoprolol Succinate Tab ER 24HR 50 MG (Tartrate Equiv): ORAL | 30 days supply | Qty: 30 | Fill #0 | Status: AC

## 2021-07-16 ENCOUNTER — Other Ambulatory Visit: Payer: Self-pay | Admitting: Family Medicine

## 2021-07-16 ENCOUNTER — Other Ambulatory Visit: Payer: Self-pay

## 2021-07-16 DIAGNOSIS — F411 Generalized anxiety disorder: Secondary | ICD-10-CM

## 2021-07-16 MED FILL — Alprazolam Tab 1 MG: ORAL | Qty: 75 | Fill #0 | Status: CN

## 2021-07-17 ENCOUNTER — Other Ambulatory Visit: Payer: Self-pay

## 2021-07-20 ENCOUNTER — Other Ambulatory Visit: Payer: Self-pay

## 2021-07-20 MED FILL — Alprazolam Tab 1 MG: ORAL | 25 days supply | Qty: 75 | Fill #0 | Status: AC

## 2021-07-20 MED FILL — Metoprolol Succinate Tab ER 24HR 50 MG (Tartrate Equiv): ORAL | 30 days supply | Qty: 30 | Fill #1 | Status: AC

## 2021-08-09 ENCOUNTER — Other Ambulatory Visit: Payer: Self-pay | Admitting: Family Medicine

## 2021-08-09 DIAGNOSIS — F411 Generalized anxiety disorder: Secondary | ICD-10-CM

## 2021-08-10 ENCOUNTER — Other Ambulatory Visit: Payer: Self-pay

## 2021-08-10 MED ORDER — ALPRAZOLAM 1 MG PO TABS
ORAL_TABLET | ORAL | 0 refills | Status: DC
  Filled 2021-08-10 – 2021-08-12 (×2): qty 30, 10d supply, fill #0

## 2021-08-10 MED ORDER — VALACYCLOVIR HCL 1 G PO TABS
ORAL_TABLET | ORAL | 0 refills | Status: DC
  Filled 2021-08-10: qty 30, 7d supply, fill #0

## 2021-08-12 ENCOUNTER — Other Ambulatory Visit: Payer: Self-pay

## 2021-08-17 ENCOUNTER — Other Ambulatory Visit: Payer: Self-pay | Admitting: Family Medicine

## 2021-08-17 DIAGNOSIS — I10 Essential (primary) hypertension: Secondary | ICD-10-CM

## 2021-08-17 DIAGNOSIS — F411 Generalized anxiety disorder: Secondary | ICD-10-CM

## 2021-08-18 ENCOUNTER — Other Ambulatory Visit: Payer: Self-pay

## 2021-08-18 MED ORDER — ALPRAZOLAM 1 MG PO TABS
ORAL_TABLET | ORAL | 0 refills | Status: DC
  Filled 2021-08-18: qty 30, fill #0
  Filled 2021-08-25: qty 30, 10d supply, fill #0

## 2021-08-18 MED ORDER — METOPROLOL SUCCINATE ER 50 MG PO TB24
ORAL_TABLET | Freq: Every day | ORAL | 1 refills | Status: DC
  Filled 2021-08-18: qty 30, 30d supply, fill #0

## 2021-08-25 ENCOUNTER — Other Ambulatory Visit: Payer: Self-pay | Admitting: Family Medicine

## 2021-08-25 ENCOUNTER — Other Ambulatory Visit: Payer: Self-pay

## 2021-08-25 ENCOUNTER — Encounter: Payer: Self-pay | Admitting: Family Medicine

## 2021-08-25 DIAGNOSIS — I10 Essential (primary) hypertension: Secondary | ICD-10-CM

## 2021-08-25 NOTE — Progress Notes (Unsigned)
I,Sha'taria Tyson,acting as a Education administrator for [employer], PA-C.,have documented all relevant documentation on the behalf of Mikey Kirschner, PA-C,as directed by  Mikey Kirschner, PA-C while in the presence of Mikey Kirschner, PA-C.   Established patient visit   Patient: Todd Chen   DOB: Sep 27, 1975   46 y.o. Male  MRN: [id number] Visit Date: 08/26/2021  Today's healthcare provider: Mikey Kirschner, PA-C   No chief complaint on file.  Subjective    HPI  Anxiety, Follow-up  He was last seen for anxiety 14 months ago. Changes made at last visit include continue current treatment.   He reports {excellent/good/fair/poor:19665} compliance with treatment. He reports {good/fair/poor:18685} tolerance of treatment. He {is/is not:21021397} having side effects. {document side effects if present:1}  He feels his anxiety is {Desc; severity:60313} and {improved/worse/unchanged:3041574} since last visit.  Symptoms: {Yes/No:20286} chest pain {Yes/No:20286} difficulty concentrating  {Yes/No:20286} dizziness {Yes/No:20286} fatigue  {Yes/No:20286} feelings of losing control {Yes/No:20286} insomnia  {Yes/No:20286} irritable {Yes/No:20286} palpitations  {Yes/No:20286} panic attacks {Yes/No:20286} racing thoughts  {Yes/No:20286} shortness of breath {Yes/No:20286} sweating  {Yes/No:20286} tremors/shakes    GAD-7 Results    07/31/2019   11:19 AM  GAD-7 Generalized Anxiety Disorder Screening Tool  1. Feeling Nervous, Anxious, or on Edge 1  2. Not Being Able to Stop or Control Worrying 3  3. Worrying Too Much About Different Things 3  4. Trouble Relaxing 1  5. Being So Restless it's Hard To Sit Still 0  6. Becoming Easily Annoyed or Irritable 1  7. Feeling Afraid As If Something Awful Might Happen 0  Total GAD-7 Score 9  Difficulty At Work, Home, or Getting  Along With Others? Somewhat difficult    PHQ-9 Scores    07/14/2020   10:03 AM 03/19/2020    8:56 AM 07/31/2019   11:18 AM   PHQ9 SCORE ONLY  PHQ-9 Total Score '1 6 5    ' --------------------------------------------------------------------------------------------------- Hypertension, follow-up  BP Readings from Last 3 Encounters:  07/14/20 (!) 136/93  05/14/20 134/86  03/19/20 133/83   Wt Readings from Last 3 Encounters:  11/25/20 287 lb (130.2 kg)  07/14/20 287 lb (130.2 kg)  05/14/20 283 lb (128.4 kg)     He was last seen for hypertension 14 months ago.  BP at that visit was 136/93. Management since that visit includes none.  He reports {excellent/good/fair/poor:19665} compliance with treatment. He {is/is not:9024} having side effects. {document side effects if present:1} He is following a {diet:21022986} diet. He {is/is not:9024} exercising. He {does/does not:200015} smoke.  Use of agents associated with hypertension: {bp agents assoc with hypertension:511::"none"}.   Outside blood pressures are {***enter patient reported home BP readings, or 'not being checked':1}. Symptoms: {Yes/No:20286} chest pain {Yes/No:20286} chest pressure  {Yes/No:20286} palpitations {Yes/No:20286} syncope  {Yes/No:20286} dyspnea {Yes/No:20286} orthopnea  {Yes/No:20286} paroxysmal nocturnal dyspnea {Yes/No:20286} lower extremity edema   Pertinent labs Lab Results  Component Value Date   CHOL 197 07/14/2020   HDL 33 (L) 07/14/2020   LDLCALC 90 07/14/2020   TRIG 451 (H) 07/14/2020   CHOLHDL 6.0 (H) 07/14/2020   Lab Results  Component Value Date   NA 141 07/14/2020   K 4.0 07/14/2020   CREATININE 0.98 07/14/2020   EGFR 99 07/14/2020   GLUCOSE 129 (H) 07/14/2020   TSH 0.897 11/24/2016     The ASCVD Risk score (Arnett DK, et al., 2019) failed to calculate for the following reasons:   The systolic blood pressure is missing  ---------------------------------------------------------------------------------------------------  Lipid/Cholesterol, Follow-up  Last lipid panel Other  pertinent labs  Lab Results   Component Value Date   CHOL 197 07/14/2020   HDL 33 (L) 07/14/2020   LDLCALC 90 07/14/2020   TRIG 451 (H) 07/14/2020   CHOLHDL 6.0 (H) 07/14/2020   Lab Results  Component Value Date   ALT 28 07/14/2020   AST 21 07/14/2020   PLT 270 03/19/2020   TSH 0.897 11/24/2016     He was last seen for this 14 months ago.  Management since that visit includes continue current treatment.  He reports {excellent/good/fair/poor:19665} compliance with treatment. He {is/is not:9024} having side effects. {document side effects if present:1}  Symptoms: {Yes/No:20286} chest pain {Yes/No:20286} chest pressure/discomfort  {Yes/No:20286} dyspnea {Yes/No:20286} lower extremity edema  {Yes/No:20286} numbness or tingling of extremity {Yes/No:20286} orthopnea  {Yes/No:20286} palpitations {Yes/No:20286} paroxysmal nocturnal dyspnea  {Yes/No:20286} speech difficulty {Yes/No:20286} syncope   Current diet: {diet habits:16563} Current exercise: {exercise types:16438}  The ASCVD Risk score (Arnett DK, et al., 2019) failed to calculate for the following reasons:   The systolic blood pressure is missing  ---------------------------------------------------------------------------------------------------   Medications: Outpatient Medications Prior to Visit  Medication Sig   albuterol (VENTOLIN HFA) 108 (90 Base) MCG/ACT inhaler INHALE 2 PUFFS BY MOUTH EVERY 6 HOURS AS NEEDED   ALPRAZolam (XANAX) 1 MG tablet TAKE 1 TABLET BY MOUTH TWO TO THREE TIMES DAILY AS NEEDED FOR ANXIETY   metoprolol succinate (TOPROL-XL) 50 MG 24 hr tablet TAKE 1 TABLET BY MOUTH DAILY. TAKE WITH OR IMMEDIATELY FOLLOWING A MEAL.   Mometasone Furoate (ASMANEX HFA) 100 MCG/ACT AERO Inhale 2 puffs into the lungs 2 (two) times daily.   omeprazole (PRILOSEC) 40 MG capsule One capsule each 30 minutes prior to main meal of day   pravastatin (PRAVACHOL) 40 MG tablet TAKE 1 TABLET BY MOUTH DAILY.   sucralfate (CARAFATE) 1 g tablet TAKE ONE TABLET  BY MOUTH FOUR TIMES A DAY WITH MEALS AND AT BEDTIME AS NEEDED   triamcinolone (NASACORT) 55 MCG/ACT AERO nasal inhaler 2 sprays each nostril qday   valACYclovir (VALTREX) 1000 MG tablet TAKE 2 TABLETS (2GM) BY MOUTH 2 TIMES DAILY AS DIRECTED   No facility-administered medications prior to visit.    Review of Systems  {Labs  Heme  Chem  Endocrine  Serology  Results Review (optional):23779}   Objective    There were no vitals taken for this visit. {Show previous vital signs (optional):23777}  Physical Exam  ***  No results found for any visits on 08/26/21.  Assessment & Plan     ***  No follow-ups on file.      {provider attestation***:1}   Mikey Kirschner, PA-C  [facility] [phone] (phone) [phone] (fax)  [place]

## 2021-08-26 ENCOUNTER — Encounter: Payer: Self-pay | Admitting: Physician Assistant

## 2021-08-26 ENCOUNTER — Ambulatory Visit (INDEPENDENT_AMBULATORY_CARE_PROVIDER_SITE_OTHER): Payer: Self-pay | Admitting: Physician Assistant

## 2021-08-26 ENCOUNTER — Other Ambulatory Visit: Payer: Self-pay

## 2021-08-26 VITALS — BP 137/105 | HR 77 | Ht 68.0 in | Wt 280.7 lb

## 2021-08-26 DIAGNOSIS — F39 Unspecified mood [affective] disorder: Secondary | ICD-10-CM

## 2021-08-26 DIAGNOSIS — I10 Essential (primary) hypertension: Secondary | ICD-10-CM

## 2021-08-26 MED ORDER — METOPROLOL SUCCINATE ER 50 MG PO TB24
ORAL_TABLET | Freq: Every day | ORAL | 1 refills | Status: DC
  Filled 2021-08-26: qty 90, fill #0
  Filled 2021-09-24: qty 90, 90d supply, fill #0
  Filled 2021-11-06 – 2021-12-28 (×2): qty 90, 90d supply, fill #1

## 2021-08-26 NOTE — Assessment & Plan Note (Addendum)
Elevated in office, pt states similarly elevated at home Hesitant to start another medication, feels it will improve; I discussed the potential consequences of elevated blood pressure Currently Managed with metoprolol 50 mg. My choice would be an ace-I or arb d/t current HR in 70s and limited control with metoprolol. Advised he is due for regular bw-- pt has another office he uses for bloodwork as it is cheaper. States he will bring results, but declines order form today Advised he f/u 6 weeks for HTN w/ Dr Sherrie Mustache

## 2021-08-26 NOTE — Assessment & Plan Note (Addendum)
Pt also has anxiety diagnosis on his chart Currently only medication w/ prn xanax. Typically is rx #75 for 1 month supply.  Last rx was #30 on 08/12/2021; he states he takes around 1/2 -1 daily now.  Advised future refills should come from PCP.  Strongly advised he see a psychiatrist-- he states he plans to see a someone nearby who he used to see.

## 2021-08-27 ENCOUNTER — Other Ambulatory Visit: Payer: Self-pay

## 2021-08-27 LAB — CBC AND DIFFERENTIAL
HCT: 46 (ref 41–53)
Hemoglobin: 16.4 (ref 13.5–17.5)
Platelets: 256 10*3/uL (ref 150–400)
WBC: 13.2

## 2021-08-27 LAB — COMPREHENSIVE METABOLIC PANEL
Albumin: 4.5 (ref 3.5–5.0)
Calcium: 9 (ref 8.7–10.7)
Globulin: 2.2
eGFR: 69

## 2021-08-27 LAB — BASIC METABOLIC PANEL
BUN: 17 (ref 4–21)
CO2: 18 (ref 13–22)
Chloride: 103 (ref 99–108)
Creatinine: 1 (ref 0.6–1.3)
Sodium: 139 (ref 137–147)

## 2021-08-27 LAB — CBC: RBC: 4.89 (ref 3.87–5.11)

## 2021-08-27 LAB — LIPID PANEL
Cholesterol: 192 (ref 0–200)
HDL: 27 — AB (ref 35–70)
LDL Cholesterol: 119
Triglycerides: 23 — AB (ref 40–160)

## 2021-08-28 ENCOUNTER — Other Ambulatory Visit: Payer: Self-pay

## 2021-08-28 ENCOUNTER — Other Ambulatory Visit: Payer: Self-pay | Admitting: Family Medicine

## 2021-08-28 DIAGNOSIS — E782 Mixed hyperlipidemia: Secondary | ICD-10-CM

## 2021-08-28 DIAGNOSIS — F411 Generalized anxiety disorder: Secondary | ICD-10-CM

## 2021-08-29 ENCOUNTER — Other Ambulatory Visit: Payer: Self-pay

## 2021-08-29 MED ORDER — PRAVASTATIN SODIUM 40 MG PO TABS
ORAL_TABLET | Freq: Every day | ORAL | 0 refills | Status: DC
  Filled 2021-08-29: qty 90, 90d supply, fill #0

## 2021-08-29 MED ORDER — VALACYCLOVIR HCL 1 G PO TABS
ORAL_TABLET | ORAL | 0 refills | Status: DC
  Filled 2021-08-29: qty 30, 7d supply, fill #0

## 2021-08-29 MED ORDER — ALPRAZOLAM 1 MG PO TABS
ORAL_TABLET | ORAL | 0 refills | Status: DC
  Filled 2021-08-29: qty 30, fill #0
  Filled 2021-09-02: qty 30, 10d supply, fill #0

## 2021-08-31 ENCOUNTER — Other Ambulatory Visit: Payer: Self-pay

## 2021-08-31 ENCOUNTER — Encounter: Payer: Self-pay | Admitting: Physician Assistant

## 2021-09-01 ENCOUNTER — Other Ambulatory Visit: Payer: Self-pay

## 2021-09-01 MED ORDER — OMEPRAZOLE 40 MG PO CPDR
DELAYED_RELEASE_CAPSULE | ORAL | 3 refills | Status: DC
  Filled 2021-09-01: qty 90, 90d supply, fill #0
  Filled 2022-04-08: qty 90, 90d supply, fill #1

## 2021-09-02 ENCOUNTER — Other Ambulatory Visit: Payer: Self-pay

## 2021-09-04 ENCOUNTER — Other Ambulatory Visit: Payer: Self-pay

## 2021-09-07 ENCOUNTER — Other Ambulatory Visit: Payer: Self-pay | Admitting: Family Medicine

## 2021-09-07 ENCOUNTER — Other Ambulatory Visit: Payer: Self-pay

## 2021-09-07 DIAGNOSIS — F411 Generalized anxiety disorder: Secondary | ICD-10-CM

## 2021-09-07 MED ORDER — ALPRAZOLAM 1 MG PO TABS
ORAL_TABLET | ORAL | 0 refills | Status: DC
Start: 2021-09-07 — End: 2021-09-09
  Filled ????-??-??: fill #0

## 2021-09-07 MED ORDER — VALACYCLOVIR HCL 1 G PO TABS
ORAL_TABLET | ORAL | 0 refills | Status: DC
  Filled 2021-09-14: qty 30, 7d supply, fill #0

## 2021-09-07 MED ORDER — TRIAMCINOLONE ACETONIDE 55 MCG/ACT NA AERO
INHALATION_SPRAY | NASAL | 0 refills | Status: AC
  Filled 2021-09-07: qty 32.4, 30d supply, fill #0
  Filled 2021-11-24 – 2022-02-22 (×2): qty 32.4, fill #0

## 2021-09-07 NOTE — Telephone Encounter (Signed)
I called and spoke with patient. He states he was seen by Alfredia Ferguson on 08/26/2021 for follow up of depression. Patient was given a refill during that appointment, but states it wasn't for the full 75 tablet prescription he normally gets. Patient is requesting the prescription be changed back to a 1 month supply of 75 tablets on the next refill. He has a follow up appointment with Dr. Sherrie Mustache scheduled for 09/28/2021.

## 2021-09-08 ENCOUNTER — Other Ambulatory Visit: Payer: Self-pay

## 2021-09-09 ENCOUNTER — Other Ambulatory Visit: Payer: Self-pay

## 2021-09-09 MED ORDER — ALPRAZOLAM 1 MG PO TABS
ORAL_TABLET | ORAL | 2 refills | Status: DC
  Filled 2021-09-09: qty 75, 25d supply, fill #0
  Filled 2021-10-02: qty 75, 25d supply, fill #1
  Filled 2021-10-25: qty 75, 25d supply, fill #2

## 2021-09-09 NOTE — Addendum Note (Signed)
Addended by: [name] on: 09/09/2021 09:55 AM   Modules accepted: Orders

## 2021-09-14 ENCOUNTER — Other Ambulatory Visit: Payer: Self-pay

## 2021-09-24 ENCOUNTER — Other Ambulatory Visit: Payer: Self-pay | Admitting: Family Medicine

## 2021-09-25 ENCOUNTER — Other Ambulatory Visit: Payer: Self-pay

## 2021-09-25 MED ORDER — VALACYCLOVIR HCL 1 G PO TABS
ORAL_TABLET | ORAL | 3 refills | Status: DC
  Filled 2021-09-25: qty 30, 7d supply, fill #0
  Filled 2021-10-25: qty 30, 7d supply, fill #1
  Filled 2021-11-06: qty 30, 7d supply, fill #2
  Filled 2021-11-30: qty 30, 7d supply, fill #3

## 2021-09-28 ENCOUNTER — Encounter: Payer: Self-pay | Admitting: Family Medicine

## 2021-09-28 ENCOUNTER — Other Ambulatory Visit: Payer: Self-pay

## 2021-09-28 ENCOUNTER — Ambulatory Visit (INDEPENDENT_AMBULATORY_CARE_PROVIDER_SITE_OTHER): Payer: Self-pay | Admitting: Family Medicine

## 2021-09-28 VITALS — BP 147/95 | HR 70 | Temp 98.0°F | Resp 16 | Wt 292.0 lb

## 2021-09-28 DIAGNOSIS — F411 Generalized anxiety disorder: Secondary | ICD-10-CM

## 2021-09-28 DIAGNOSIS — R739 Hyperglycemia, unspecified: Secondary | ICD-10-CM

## 2021-09-28 LAB — POCT GLYCOSYLATED HEMOGLOBIN (HGB A1C)
Est. average glucose Bld gHb Est-mCnc: 111
Hemoglobin A1C: 5.5 % (ref 4.0–5.6)

## 2021-09-28 MED ORDER — BUSPIRONE HCL 7.5 MG PO TABS
ORAL_TABLET | ORAL | 2 refills | Status: DC
Start: 2021-09-28 — End: 2021-12-06
  Filled 2021-09-28: qty 47, 20d supply, fill #0
  Filled 2021-09-28: qty 13, 5d supply, fill #0
  Filled 2021-10-25: qty 60, 30d supply, fill #1
  Filled 2021-11-06: qty 60, 15d supply, fill #2

## 2021-09-28 NOTE — Progress Notes (Addendum)
I,Roshena L Chambers,acting as a scribe for Lelon Huh, MD.,have documented all relevant documentation on the behalf of Lelon Huh, MD,as directed by  Lelon Huh, MD while in the presence of Lelon Huh, MD.    Established patient visit   Patient: Todd Chen   DOB: [birth year]   46 y.o. Male  MRN: [id number] Visit Date: 09/28/2021  Today's healthcare provider: Lelon Huh, MD   Chief Complaint  Patient presents with   Hypertension   Anxiety   Subjective    HPI  Hypertension, follow-up  BP Readings from Last 3 Encounters:  09/28/21 (!) 147/95  08/26/21 (!) 137/105  07/14/20 (!) 136/93   Wt Readings from Last 3 Encounters:  09/28/21 292 lb (132.5 kg)  08/26/21 280 lb 11.2 oz (127.3 kg)  11/25/20 287 lb (130.2 kg)     He was last seen for hypertension 4 weeks ago (seen by Mikey Kirschner, PA-C). BP at that visit was 137/105. Management during that visit includes recommend adding another medication; patient declined.  He reports good compliance with treatment. He is not having side effects.  He is following a Regular diet. He is not exercising. He does smoke.  Use of agents associated with hypertension: none.   Outside blood pressures are checked occasionally. Symptoms: No chest pain No chest pressure  No palpitations No syncope  No dyspnea No orthopnea  No paroxysmal nocturnal dyspnea No lower extremity edema   Pertinent labs Lab Results  Component Value Date   CHOL 192 08/27/2021   HDL 27 (A) 08/27/2021   LDLCALC 119 08/27/2021   TRIG 23 (A) 08/27/2021   CHOLHDL 6.0 (H) 07/14/2020   Lab Results  Component Value Date   NA 139 08/27/2021   K 4.0 07/14/2020   CREATININE 1.0 08/27/2021   EGFR 69 08/27/2021   GLUCOSE 129 (H) 07/14/2020   TSH 0.897 11/24/2016     The 10-year ASCVD risk score (Arnett DK, et al., 2019) is: 14.2%* (Cholesterol units were  assumed)  ---------------------------------------------------------------------------------------------------  Anxiety, Follow-up  He was last seen for anxiety 4 weeks ago (seen by Mikey Kirschner, PA-C). Changes made at last visit include none; medication refill provided.   He reports good compliance with treatment. He reports good tolerance of treatment. He is not having side effects.   He feels his anxiety is moderate and Unchanged since last visit.  Symptoms: No chest pain No difficulty concentrating  No dizziness No fatigue  No feelings of losing control No insomnia  No irritable No palpitations  No panic attacks No racing thoughts  No shortness of breath No sweating  No tremors/shakes    GAD-7 Results    08/26/2021   10:39 AM 07/31/2019   11:19 AM  GAD-7 Generalized Anxiety Disorder Screening Tool  1. Feeling Nervous, Anxious, or on Edge 3 1  2. Not Being Able to Stop or Control Worrying 3 3  3. Worrying Too Much About Different Things 3 3  4. Trouble Relaxing 2 1  5. Being So Restless it's Hard To Sit Still  0  6. Becoming Easily Annoyed or Irritable 1 1  7. Feeling Afraid As If Something Awful Might Happen 0 0  Total GAD-7 Score  9  Difficulty At Work, Home, or Getting  Along With Others? Somewhat difficult Somewhat difficult    PHQ-9 Scores    08/26/2021   10:03 AM 07/14/2020   10:03 AM 03/19/2020    8:56 AM  PHQ9 SCORE ONLY  PHQ-9 Total Score  '8 1 6    ' ---------------------------------------------------------------------------------------------------    Medications: Outpatient Medications Prior to Visit  Medication Sig   albuterol (VENTOLIN HFA) 108 (90 Base) MCG/ACT inhaler INHALE 2 PUFFS BY MOUTH EVERY 6 HOURS AS NEEDED   ALPRAZolam (XANAX) 1 MG tablet TAKE 1 TABLET BY MOUTH TWO TO THREE TIMES DAILY AS NEEDED FOR ANXIETY   metoprolol succinate (TOPROL-XL) 50 MG 24 hr tablet TAKE 1 TABLET BY MOUTH DAILY. TAKE WITH OR IMMEDIATELY FOLLOWING A MEAL.    Mometasone Furoate (ASMANEX HFA) 100 MCG/ACT AERO Inhale 2 puffs into the lungs 2 (two) times daily.   omeprazole (PRILOSEC) 40 MG capsule One capsule each 30 minutes prior to main meal of day   pravastatin (PRAVACHOL) 40 MG tablet TAKE 1 TABLET BY MOUTH DAILY.   sucralfate (CARAFATE) 1 g tablet TAKE ONE TABLET BY MOUTH FOUR TIMES A DAY WITH MEALS AND AT BEDTIME AS NEEDED   triamcinolone (NASACORT) 55 MCG/ACT AERO nasal inhaler 2 sprays each nostril qday   triamcinolone (NASACORT) 55 MCG/ACT AERO nasal inhaler USE 2 SPRAYS IN EACH NOSTRIL EVERY DAY   valACYclovir (VALTREX) 1000 MG tablet TAKE 2 TABLETS (2GM) BY MOUTH 2 TIMES DAILY AS DIRECTED   No facility-administered medications prior to visit.    Review of Systems  Constitutional:  Negative for appetite change, chills and fever.  HENT:  Positive for tinnitus (in right ear).        Tongue pain  Respiratory:  Negative for chest tightness, shortness of breath and wheezing.   Cardiovascular:  Negative for chest pain and palpitations.  Gastrointestinal:  Negative for abdominal pain, nausea and vomiting.       Objective    BP (!) 147/95 (BP Location: Right Arm, Patient Position: Sitting, Cuff Size: Large)   Pulse 70   Temp 98 F (36.7 C) (Oral)   Resp 16   Wt 292 lb (132.5 kg)   SpO2 98% Comment: room air  BMI 44.40 kg/m    Today's Vitals   09/28/21 1459 09/28/21 1504  BP: (!) 147/95 (!) 147/95  Pulse: 70   Resp: 16   Temp: 98 F (36.7 C)   TempSrc: Oral   SpO2: 98%   Weight: 292 lb (132.5 kg)    Body mass index is 44.4 kg/m.   Physical Exam  General appearance: Obese male, cooperative and in no acute distress Head: Normocephalic, without obvious abnormality, atraumatic Respiratory: Respirations even and unlabored, normal respiratory rate Extremities: All extremities are intact.  Skin: Skin color, texture, turgor normal. No rashes seen  Psych: Appropriate mood and affect. Neurologic: Mental status: Alert, oriented  to person, place, and time, thought content appropriate.   Results for orders placed or performed in visit on 09/28/21  POCT HgB A1C  Result Value Ref Range   Hemoglobin A1C 5.5 4.0 - 5.6 %   Est. average glucose Bld gHb Est-mCnc 111     Assessment & Plan     1. Hyperglycemia A1c much better.   2. Anxiety, generalized He is trying to get off alprazolam altogether. He has never been a regular alcohol consumer, but has now stopped completely for the last month. He does not tolerate SSRIs due to sexual side effects. Will try  busPIRone (BUSPAR) 7.5 MG tablet; 1/2 tablet twice a day for 7 days then, 1 tablet twice a day for 7 days, then 1 in the morning and 2 at night for 7 days, then 2 twice a day  Dispense: 60 tablet; Refill: 2  Future Appointments  Date Time Provider [place]  11/09/2021 10:00 AM Birdie Sons, MD [facility]  12/01/2021  9:00 AM Birdie Sons, MD [facility]        The entirety of the information documented in the History of Present Illness, Review of Systems and Physical Exam were personally obtained by me. Portions of this information were initially documented by the CMA and reviewed by me for thoroughness and accuracy.     Lelon Huh, MD  [facility] [phone] (phone) [phone] (fax)  [place]

## 2021-10-02 ENCOUNTER — Other Ambulatory Visit: Payer: Self-pay

## 2021-10-25 ENCOUNTER — Other Ambulatory Visit: Payer: Self-pay | Admitting: Family Medicine

## 2021-10-25 DIAGNOSIS — K219 Gastro-esophageal reflux disease without esophagitis: Secondary | ICD-10-CM

## 2021-10-25 IMAGING — MR MR BRAIN/IAC WO/W CM
11 of 12 series · 41 of 48 positions shown · IV contrast (GADAVIST)
Comparison: No pertinent prior exams available for comparison.

CLINICAL DATA: Tinnitus, right ear. Additional history provided by
scanning technologist: Patient reports right ear pain radiating into
throat region; ringing in right ear.

EXAM:
MRI HEAD WITHOUT AND WITH CONTRAST
TECHNIQUE: Multiplanar, multiecho pulse sequences of the brain and surrounding
structures were obtained without and with intravenous contrast.
CONTRAST:  10mL GADAVIST GADOBUTROL 1 MMOL/ML IV SOLN

[Series 2: DWI · axial · 3.0mm · 1.20mm/px · z∈[-109,+58]mm · 13 of 114 slices shown (1 of 2)]
[im 1/114]
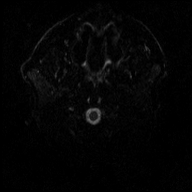
[im 10/114]
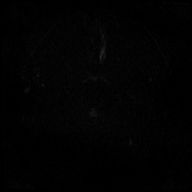
[im 19/114]
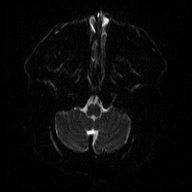
[im 29/114]
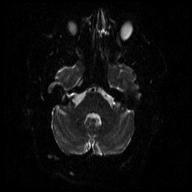
[im 38/114]
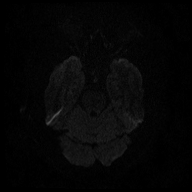
[im 48/114]
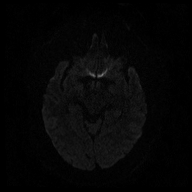
[im 57/114]
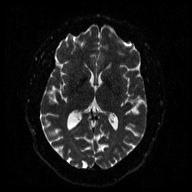
[im 66/114]
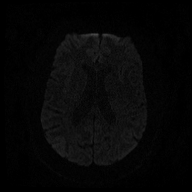
[im 76/114]
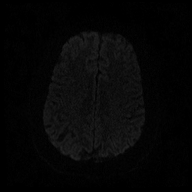
[im 85/114]
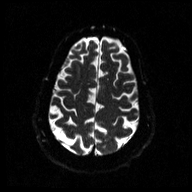
[im 95/114]
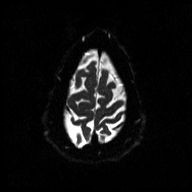
[im 104/114]
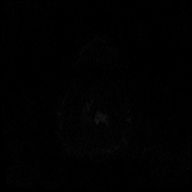
[im 114/114]
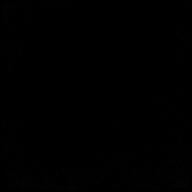

[Series 3: DWI · axial · 3.0mm · 1.20mm/px · z∈[-109,+58]mm · 6 of 57 slices shown (2 of 2)]
[im 1/57]
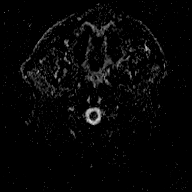
[im 12/57]
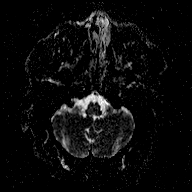
[im 23/57]
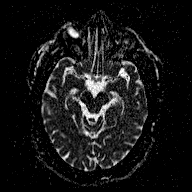
[im 34/57]
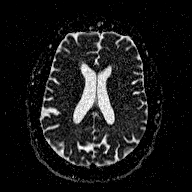
[im 45/57]
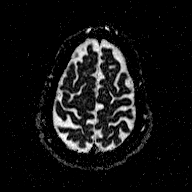
[im 57/57]
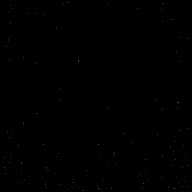

[Series 4: T1 · sagittal · 5.0mm · 0.45mm/px · 3 of 25 slices shown (1 of 3)]
[im 1/25]
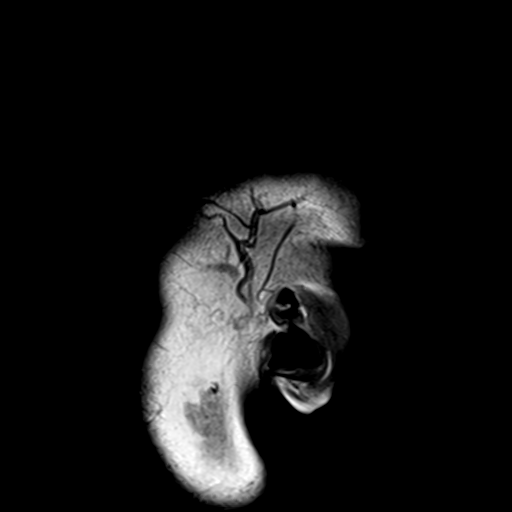
[im 13/25]
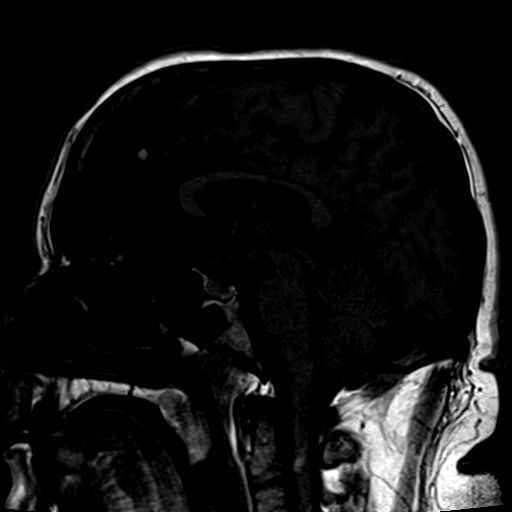
[im 25/25]
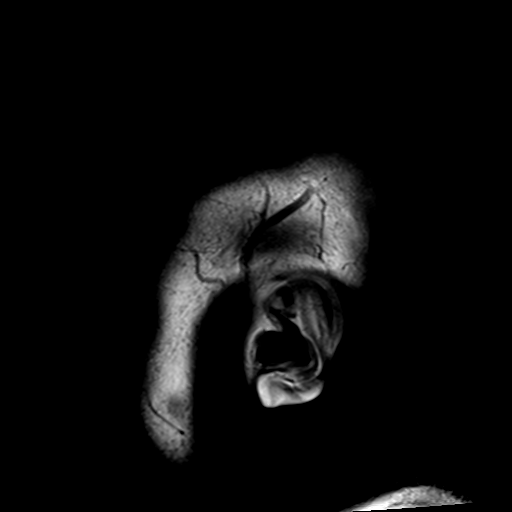

[Series 5: T2 · axial · 5.0mm · 0.72mm/px · z∈[-110,+59]mm · 3 of 27 slices shown (1 of 2)]
[im 1/27]
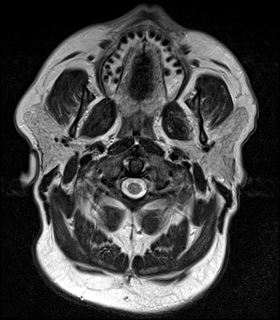
[im 14/27]
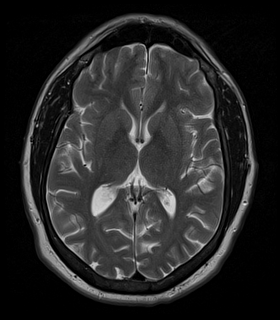
[im 27/27]
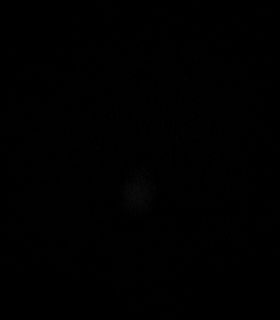

[Series 6: T2 · axial · 5.0mm · 0.72mm/px · z∈[-110,+59]mm · 3 of 27 slices shown (2 of 2)]
[im 1/27]
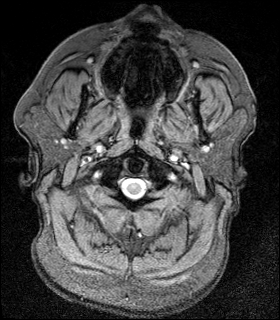
[im 14/27]
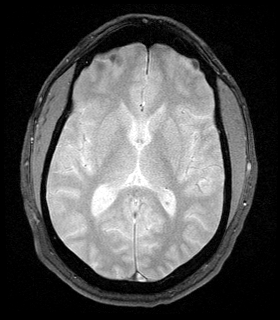
[im 27/27]
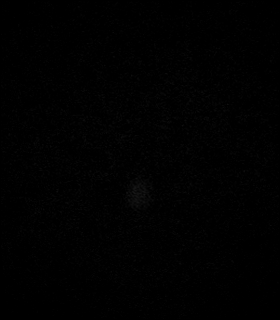

[Series 7: T1 · coronal · 3.0mm · 0.37mm/px · 1 of 11 slices shown (2 of 3)]
[im 1/11]
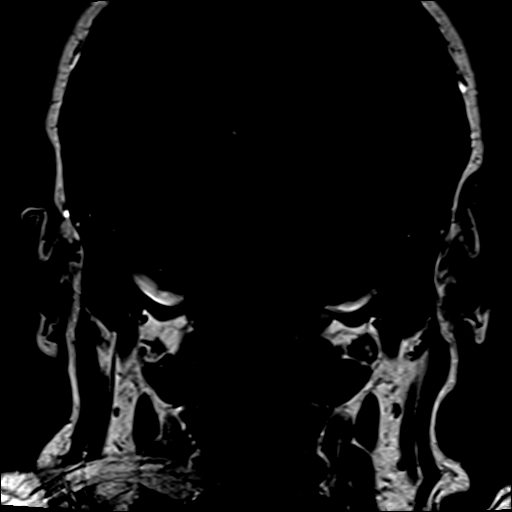

[Series 8: FLAIR · axial · 5.0mm · 0.45mm/px · z∈[-110,+59]mm · 3 of 27 slices shown]
[im 1/27]
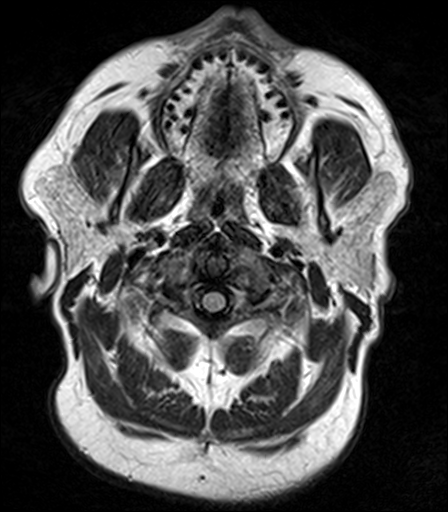
[im 14/27]
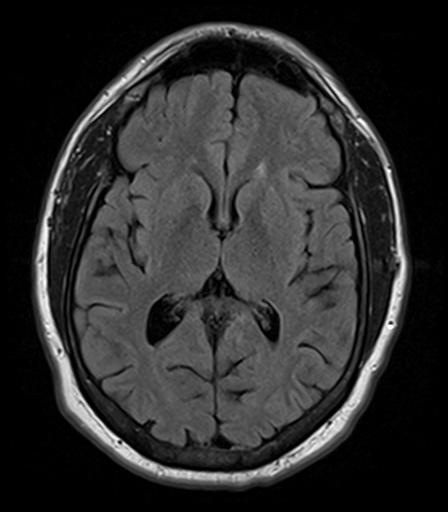
[im 27/27]
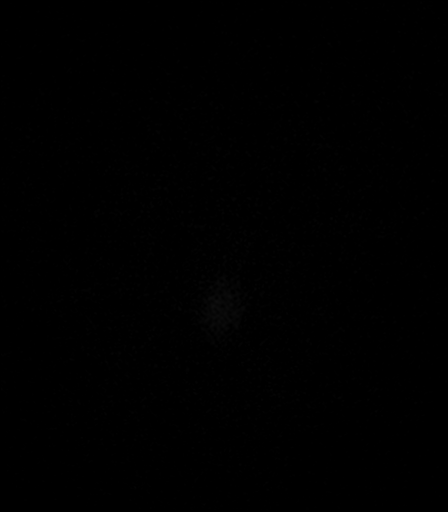

[Series 9: T1 · axial · 3.0mm · 0.37mm/px · 1 of 11 slices shown (3 of 3)]
[im 1/11]
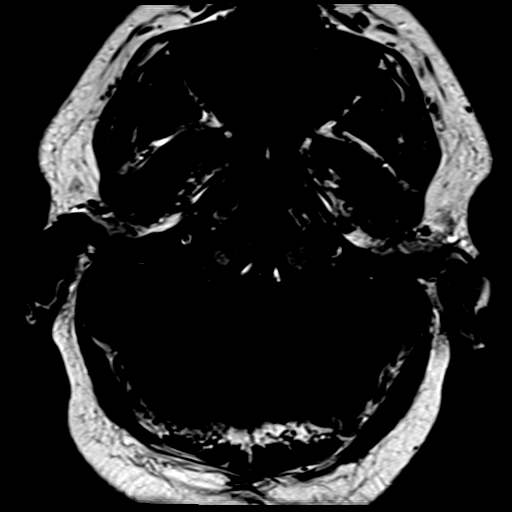

[Series 11: T1 post-contrast · axial · 3.0mm · 0.37mm/px · 1 of 11 slices shown (1 of 3)]
[im 1/11]
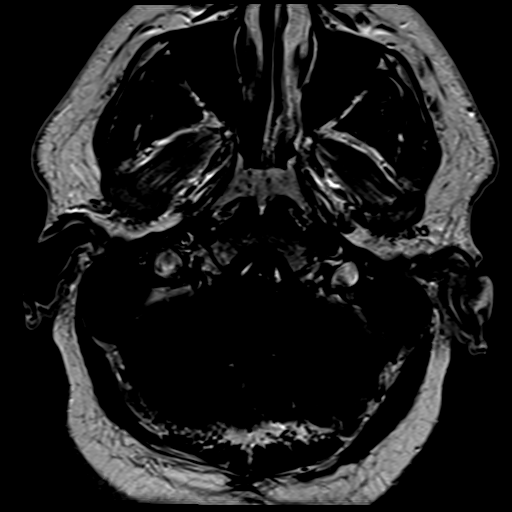

[Series 12: T1 post-contrast · coronal · 3.0mm · 0.37mm/px · 1 of 11 slices shown (2 of 3)]
[im 1/11]
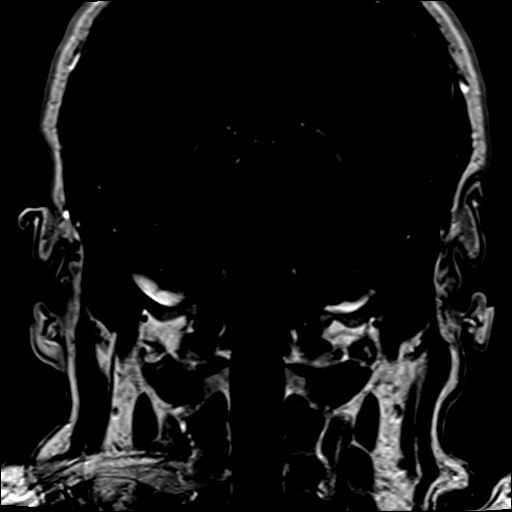

[Series 13: T1 post-contrast · axial · 3.0mm · 1.00mm/px · z∈[-107,+58]mm · 6 of 56 slices shown (3 of 3)]
[im 1/56]
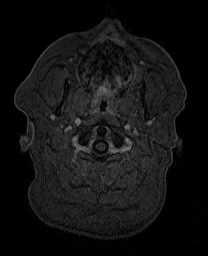
[im 12/56]
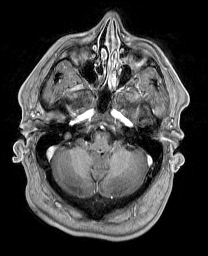
[im 23/56]
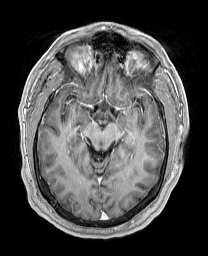
[im 34/56]
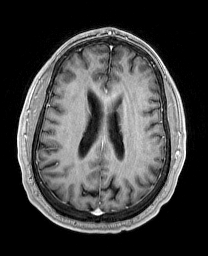
[im 45/56]
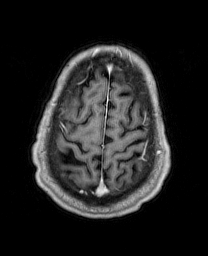
[im 56/56]
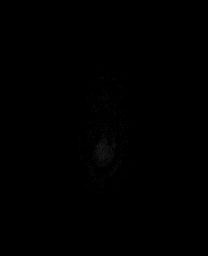

[41 of 48 positions shown; findings below may reference images not displayed]

FINDINGS: Brain:

Mild generalized cerebral atrophy.

No cortical encephalomalacia is identified. No significant cerebral
white matter disease for age.

No evidence of an intracranial mass. Specifically, no
cerebellopontine angle mass or internal auditory canal lesion is
demonstrated. Unremarkable appearance of the seventh and eighth
cranial nerves bilaterally.

There is no acute infarct.

No chronic intracranial blood products.

No extra-axial fluid collection.

No midline shift.

No pathologic intracranial enhancement.

Vascular: Maintained flow voids within the proximal large arterial
vessels.

Skull and upper cervical spine: No focal suspicious marrow lesion.

Sinuses/Orbits: Visualized orbits show no acute finding. Mild
bilateral ethmoid sinus mucosal thickening.
IMPRESSION: No evidence of acute intracranial abnormality.

No cerebellopontine angle or internal auditory canal mass.

Mild generalized cerebral atrophy.

Mild bilateral ethmoid sinus mucosal thickening.

## 2021-10-26 ENCOUNTER — Other Ambulatory Visit: Payer: Self-pay

## 2021-10-26 MED ORDER — SUCRALFATE 1 G PO TABS
ORAL_TABLET | ORAL | 3 refills | Status: DC
  Filled 2021-10-26: qty 60, 15d supply, fill #0
  Filled 2022-05-11: qty 60, 15d supply, fill #1

## 2021-11-06 ENCOUNTER — Other Ambulatory Visit: Payer: Self-pay | Admitting: Family Medicine

## 2021-11-06 DIAGNOSIS — F411 Generalized anxiety disorder: Secondary | ICD-10-CM

## 2021-11-08 ENCOUNTER — Other Ambulatory Visit: Payer: Self-pay

## 2021-11-08 ENCOUNTER — Other Ambulatory Visit: Payer: Self-pay | Admitting: Family Medicine

## 2021-11-08 DIAGNOSIS — F411 Generalized anxiety disorder: Secondary | ICD-10-CM

## 2021-11-09 ENCOUNTER — Other Ambulatory Visit: Payer: Self-pay

## 2021-11-09 ENCOUNTER — Ambulatory Visit: Payer: 59 | Admitting: Family Medicine

## 2021-11-09 NOTE — Progress Notes (Deleted)
Established patient visit   Patient: Todd Chen   DOB: 09/25/1975   46 y.o. Male  MRN: [id number] Visit Date: 11/09/2021  Today's healthcare provider: Mila Merry, MD   No chief complaint on file.  Subjective    HPI  Anxiety, Follow-up  He was last seen for anxiety on 09/28/2021.   Changes made at last visit include trying busPIRone (BUSPAR) 7.5 MG tablet; 1/2 tablet twice a day for 7 days then, 1 tablet twice a day for 7 days, then 1 in the morning and 2 at night for 7 days, then 2 twice a day.   He reports {excellent/good/fair/poor:19665} compliance with treatment. He reports {good/fair/poor:18685} tolerance of treatment. He {is/is not:21021397} having side effects. {document side effects if present:1}  He feels his anxiety is {Desc; severity:60313} and {improved/worse/unchanged:3041574} since last visit.  Symptoms: {Yes/No:20286} chest pain {Yes/No:20286} difficulty concentrating  {Yes/No:20286} dizziness {Yes/No:20286} fatigue  {Yes/No:20286} feelings of losing control {Yes/No:20286} insomnia  {Yes/No:20286} irritable {Yes/No:20286} palpitations  {Yes/No:20286} panic attacks {Yes/No:20286} racing thoughts  {Yes/No:20286} shortness of breath {Yes/No:20286} sweating  {Yes/No:20286} tremors/shakes    GAD-7 Results    08/26/2021   10:39 AM 07/31/2019   11:19 AM  GAD-7 Generalized Anxiety Disorder Screening Tool  1. Feeling Nervous, Anxious, or on Edge 3 1  2. Not Being Able to Stop or Control Worrying 3 3  3. Worrying Too Much About Different Things 3 3  4. Trouble Relaxing 2 1  5. Being So Restless it's Hard To Sit Still  0  6. Becoming Easily Annoyed or Irritable 1 1  7. Feeling Afraid As If Something Awful Might Happen 0 0  Total GAD-7 Score  9  Difficulty At Work, Home, or Getting  Along With Others? Somewhat difficult Somewhat difficult    PHQ-9 Scores    08/26/2021   10:03 AM 07/14/2020   10:03 AM 03/19/2020    8:56 AM  PHQ9 SCORE ONLY  PHQ-9  Total Score 8 1 6     ---------------------------------------------------------------------------------------------------   Medications: Outpatient Medications Prior to Visit  Medication Sig   albuterol (VENTOLIN HFA) 108 (90 Base) MCG/ACT inhaler INHALE 2 PUFFS BY MOUTH EVERY 6 HOURS AS NEEDED   ALPRAZolam (XANAX) 1 MG tablet TAKE 1 TABLET BY MOUTH TWO TO THREE TIMES DAILY AS NEEDED FOR ANXIETY   busPIRone (BUSPAR) 7.5 MG tablet 1/2 tablet twice a day for 7 days then, 1 tablet twice a day for 7 days, then 1 in the morning and 2 at night for 7 days, then 2 twice a day   metoprolol succinate (TOPROL-XL) 50 MG 24 hr tablet TAKE 1 TABLET BY MOUTH DAILY. TAKE WITH OR IMMEDIATELY FOLLOWING A MEAL.   Mometasone Furoate (ASMANEX HFA) 100 MCG/ACT AERO Inhale 2 puffs into the lungs 2 (two) times daily.   omeprazole (PRILOSEC) 40 MG capsule One capsule each 30 minutes prior to main meal of day   pravastatin (PRAVACHOL) 40 MG tablet TAKE 1 TABLET BY MOUTH DAILY.   sucralfate (CARAFATE) 1 g tablet TAKE ONE TABLET BY MOUTH FOUR TIMES A DAY WITH MEALS AND AT BEDTIME AS NEEDED   triamcinolone (NASACORT) 55 MCG/ACT AERO nasal inhaler 2 sprays each nostril qday   triamcinolone (NASACORT) 55 MCG/ACT AERO nasal inhaler USE 2 SPRAYS IN EACH NOSTRIL EVERY DAY   valACYclovir (VALTREX) 1000 MG tablet TAKE 2 TABLETS (2GM) BY MOUTH 2 TIMES DAILY AS DIRECTED   No facility-administered medications prior to visit.    Review of Systems  {Labs  Heme  Chem  Endocrine  Serology  Results Review (optional):23779}   Objective    There were no vitals taken for this visit. {Show previous vital signs (optional):23777}  Physical Exam  ***  No results found for any visits on 11/09/21.  Assessment & Plan     ***  No follow-ups on file.      {provider attestation***:1}   Mila Merry, MD  [facility] [phone] (phone) [phone] (fax)  [facility]

## 2021-11-10 ENCOUNTER — Other Ambulatory Visit: Payer: Self-pay

## 2021-11-13 ENCOUNTER — Other Ambulatory Visit: Payer: Self-pay

## 2021-11-13 MED FILL — Alprazolam Tab 1 MG: ORAL | Qty: 75 | Fill #0 | Status: CN

## 2021-11-15 ENCOUNTER — Other Ambulatory Visit: Payer: Self-pay

## 2021-11-16 ENCOUNTER — Other Ambulatory Visit: Payer: Self-pay

## 2021-11-17 MED FILL — Alprazolam Tab 1 MG: ORAL | Qty: 75 | Fill #0 | Status: CN

## 2021-11-18 ENCOUNTER — Other Ambulatory Visit: Payer: Self-pay

## 2021-11-20 ENCOUNTER — Other Ambulatory Visit: Payer: Self-pay

## 2021-11-20 MED FILL — Alprazolam Tab 1 MG: ORAL | 25 days supply | Qty: 75 | Fill #0 | Status: AC

## 2021-11-24 ENCOUNTER — Other Ambulatory Visit: Payer: Self-pay

## 2021-11-30 ENCOUNTER — Other Ambulatory Visit: Payer: Self-pay | Admitting: Family Medicine

## 2021-11-30 DIAGNOSIS — F411 Generalized anxiety disorder: Secondary | ICD-10-CM

## 2021-12-01 ENCOUNTER — Other Ambulatory Visit: Payer: Self-pay

## 2021-12-01 ENCOUNTER — Other Ambulatory Visit: Payer: Self-pay | Admitting: Family Medicine

## 2021-12-01 ENCOUNTER — Encounter: Payer: 59 | Admitting: Family Medicine

## 2021-12-01 DIAGNOSIS — F411 Generalized anxiety disorder: Secondary | ICD-10-CM

## 2021-12-06 ENCOUNTER — Other Ambulatory Visit: Payer: Self-pay

## 2021-12-06 ENCOUNTER — Other Ambulatory Visit: Payer: Self-pay | Admitting: Family Medicine

## 2021-12-06 DIAGNOSIS — F411 Generalized anxiety disorder: Secondary | ICD-10-CM

## 2021-12-06 MED ORDER — BUSPIRONE HCL 7.5 MG PO TABS
7.5000 mg | ORAL_TABLET | Freq: Two times a day (BID) | ORAL | 0 refills | Status: DC
  Filled 2021-12-06 – 2021-12-07 (×2): qty 120, 30d supply, fill #0

## 2021-12-07 ENCOUNTER — Other Ambulatory Visit: Payer: Self-pay

## 2021-12-08 ENCOUNTER — Encounter: Payer: Self-pay | Admitting: Family Medicine

## 2021-12-08 ENCOUNTER — Ambulatory Visit (INDEPENDENT_AMBULATORY_CARE_PROVIDER_SITE_OTHER): Payer: Self-pay | Admitting: Family Medicine

## 2021-12-08 ENCOUNTER — Other Ambulatory Visit: Payer: Self-pay

## 2021-12-08 DIAGNOSIS — E782 Mixed hyperlipidemia: Secondary | ICD-10-CM

## 2021-12-08 DIAGNOSIS — F411 Generalized anxiety disorder: Secondary | ICD-10-CM

## 2021-12-08 DIAGNOSIS — G4700 Insomnia, unspecified: Secondary | ICD-10-CM

## 2021-12-08 MED ORDER — ALPRAZOLAM 1 MG PO TABS
ORAL_TABLET | ORAL | 3 refills | Status: DC
  Filled 2021-12-08: qty 75, fill #0
  Filled 2021-12-11: qty 75, 25d supply, fill #0
  Filled 2022-01-03: qty 75, 25d supply, fill #1
  Filled 2022-01-28: qty 75, 25d supply, fill #2
  Filled 2022-02-22: qty 75, 25d supply, fill #3

## 2021-12-08 NOTE — Progress Notes (Signed)
I,Roshena L Chambers,acting as a scribe for Mila Merry, MD.,have documented all relevant documentation on the behalf of Mila Merry, MD,as directed by  Mila Merry, MD while in the presence of Mila Merry, MD.  Established patient visit   Patient: Todd Chen   DOB: 11-Jul-1975   46 y.o. Male  MRN: [id number] Visit Date: 12/08/2021  Today's healthcare provider: Mila Merry, MD   Chief Complaint  Patient presents with   Anxiety   Subjective    HPI  Anxiety, Follow-up   He was last seen for anxiety on 09/28/2021.   Changes made at last visit include trying busPIRone (BUSPAR) 7.5 MG tablet; 1/2 tablet twice a day for 7 days then, 1 tablet twice a day for 7 days, then 1 in the morning and 2 at night for 7 days, then 2 twice a day.          He reports fair compliance with treatment. He states that taking 1 buspirone twice a day has helped significantly with anxiety, but he does not tolerate two at a time. He is not taking alprazolam at all during the day, but taking 2 or 3 every night to help get to sleep.    Symptoms: No chest pain No difficulty concentrating  No dizziness No fatigue  No feelings of losing control Yes insomnia  No irritable No palpitations  No panic attacks No racing thoughts  No shortness of breath No sweating  No tremors/shakes         12/08/2021    3:20 PM 08/26/2021   10:39 AM 07/31/2019   11:19 AM  GAD 7 : Generalized Anxiety Score  Nervous, Anxious, on Edge 1 3 1   Control/stop worrying 1 3 3   Worry too much - different things 1 3 3   Trouble relaxing 1 2 1   Restless   0  Easily annoyed or irritable 2 1 1   Afraid - awful might happen 0 0 0  Total GAD 7 Score   9  Anxiety Difficulty Somewhat difficult Somewhat difficult Somewhat difficult          12/08/2021    3:14 PM 08/26/2021   10:03 AM 07/14/2020   10:03 AM  Depression screen PHQ 2/9  Decreased Interest 1 1 0  Down, Depressed, Hopeless 1 1 0  PHQ - 2 Score 2 2 0  Altered  sleeping 3 1 1   Tired, decreased energy 3 1 0  Change in appetite 3 1 0  Feeling bad or failure about yourself  1 1 0  Trouble concentrating 1 2 0  Moving slowly or fidgety/restless 0 0 0  Suicidal thoughts 0 0 0  PHQ-9 Score 13 8 1   Difficult doing work/chores Very difficult Somewhat difficult Not difficult at all    He also states that he stopped taking pravastatin a few months ago due to having persistent 'egg burps' which have resolved since stopping pravastatin.   Medications: Outpatient Medications Prior to Visit  Medication Sig   albuterol (VENTOLIN HFA) 108 (90 Base) MCG/ACT inhaler INHALE 2 PUFFS BY MOUTH EVERY 6 HOURS AS NEEDED   ALPRAZolam (XANAX) 1 MG tablet TAKE 1 TABLET BY MOUTH TWO TO THREE TIMES DAILY AS NEEDED FOR ANXIETY   busPIRone (BUSPAR) 7.5 MG tablet Take 1-2 tablets (7.5-15 mg total) by mouth 2 (two) times daily.   metoprolol succinate (TOPROL-XL) 50 MG 24 hr tablet TAKE 1 TABLET BY MOUTH DAILY. TAKE WITH OR IMMEDIATELY FOLLOWING A MEAL.   Mometasone  Furoate [facility] HFA) 100 MCG/ACT AERO Inhale 2 puffs into the lungs 2 (two) times daily.   omeprazole (PRILOSEC) 40 MG capsule One capsule each 30 minutes prior to main meal of day   pravastatin (PRAVACHOL) 40 MG tablet TAKE 1 TABLET BY MOUTH DAILY. (Patient not taking: Reported on 12/08/2021)   sucralfate (CARAFATE) 1 g tablet TAKE ONE TABLET BY MOUTH FOUR TIMES A DAY WITH MEALS AND AT BEDTIME AS NEEDED   triamcinolone (NASACORT) 55 MCG/ACT AERO nasal inhaler 2 sprays each nostril qday   triamcinolone (NASACORT) 55 MCG/ACT AERO nasal inhaler USE 2 SPRAYS IN EACH NOSTRIL EVERY DAY   valACYclovir (VALTREX) 1000 MG tablet TAKE 2 TABLETS (2GM) BY MOUTH 2 TIMES DAILY AS DIRECTED   No facility-administered medications prior to visit.    Review of Systems  Constitutional:  Negative for appetite change, chills and fever.  Respiratory:  Negative for chest tightness, shortness of breath and wheezing.   Cardiovascular:   Negative for chest pain and palpitations.  Gastrointestinal:  Negative for abdominal pain, nausea and vomiting.  Psychiatric/Behavioral:  Positive for sleep disturbance.        Objective    BP 136/89 (BP Location: Right Arm, Patient Position: Sitting, Cuff Size: Large)   Pulse 64   Temp 98.1 F (36.7 C) (Oral)   Resp 18   Wt 300 lb (136.1 kg)   SpO2 100% Comment: room air  BMI 45.61 kg/m    Physical Exam   General appearance: Severely obese male, cooperative and in no acute distress Head: Normocephalic, without obvious abnormality, atraumatic Respiratory: Respirations even and unlabored, normal respiratory rate Extremities: All extremities are intact.  Skin: Skin color, texture, turgor normal. No rashes seen  Psych: Appropriate mood and affect. Neurologic: Mental status: Alert, oriented to person, place, and time, thought content appropriate.    Assessment & Plan     1. Anxiety, generalized Buspirone 7.5 bid has been very helpful for morning and afternoon anxiety, but he did not tolerate higher dose.  - ALPRAZolam (XANAX) 1 MG tablet; TAKE 1 TABLET BY MOUTH TWO TO THREE TIMES DAILY AS NEEDED FOR ANXIETY  Dispense: 75 tablet; Refill: 3  2. Morbid obesity ([place]) Discussed adding weight loss medication such as Ozempic. He isn't interested at this time since he has no insurance.   3. Insomnia, unspecified type Anxiety is better but he is still having to take 2 or 3 alprazolam every night to get to sleep. He has completely stopped drinking alcohol. Suggested he take an extra 1/2 tablet buspirone in the evening to help sleep.   4. Hyperlipidemia, mixed Off of pravastatin for a few months since it was causing reflux and 'egg burps' he is planning on getting lipids checked at outside lab sometime before his follow up in 3 months.       The entirety of the information documented in the History of Present Illness, Review of Systems and Physical Exam were personally obtained by me.  Portions of this information were initially documented by the CMA and reviewed by me for thoroughness and accuracy.     [place]  [address] [facility] [phone] (phone) [phone] (fax)  [place]

## 2021-12-08 NOTE — Patient Instructions (Addendum)
Please review the attached list of medications and notify my office if there are any errors.   Try taking an extra 1/2 tablet of buspirone every evening to try to settle your mind down which could make it easier to fall asleep

## 2021-12-13 ENCOUNTER — Other Ambulatory Visit: Payer: Self-pay

## 2021-12-14 ENCOUNTER — Other Ambulatory Visit: Payer: Self-pay

## 2021-12-29 ENCOUNTER — Other Ambulatory Visit: Payer: Self-pay

## 2022-01-03 ENCOUNTER — Other Ambulatory Visit: Payer: Self-pay | Admitting: Family Medicine

## 2022-01-03 ENCOUNTER — Other Ambulatory Visit: Payer: Self-pay

## 2022-01-04 ENCOUNTER — Other Ambulatory Visit: Payer: Self-pay

## 2022-01-04 MED FILL — Valacyclovir HCl Tab 1 GM: ORAL | 7 days supply | Qty: 30 | Fill #0 | Status: CN

## 2022-01-19 ENCOUNTER — Other Ambulatory Visit: Payer: Self-pay

## 2022-01-28 ENCOUNTER — Other Ambulatory Visit: Payer: Self-pay | Admitting: Family Medicine

## 2022-01-28 MED FILL — Valacyclovir HCl Tab 1 GM: ORAL | 8 days supply | Qty: 30 | Fill #0 | Status: AC

## 2022-01-29 ENCOUNTER — Other Ambulatory Visit: Payer: Self-pay | Admitting: Family Medicine

## 2022-01-29 ENCOUNTER — Other Ambulatory Visit: Payer: Self-pay

## 2022-01-29 MED FILL — Albuterol Sulfate Inhal Aero 108 MCG/ACT (90MCG Base Equiv): RESPIRATORY_TRACT | 25 days supply | Qty: 6.7 | Fill #0 | Status: AC

## 2022-01-29 NOTE — Telephone Encounter (Signed)
Requested Prescriptions  Pending Prescriptions Disp Refills   albuterol (VENTOLIN HFA) 108 (90 Base) MCG/ACT inhaler 18 g 3    Sig: INHALE 2 PUFFS BY MOUTH EVERY 6 HOURS AS NEEDED     Pulmonology:  Beta Agonists 2 Passed - 01/29/2022 11:25 AM      Passed - Last BP in normal range    BP Readings from Last 1 Encounters:  12/08/21 136/89         Passed - Last Heart Rate in normal range    Pulse Readings from Last 1 Encounters:  12/08/21 64         Passed - Valid encounter within last 12 months    Recent Outpatient Visits           1 month ago Morbid obesity [facility])   [facility] [name], MD   4 months ago Hyperglycemia   [facility] [name], MD   5 months ago Episodic mood disorder [facility])   [facility] Alfredia Ferguson, PA-C   1 year ago Chronic otitis media, unspecified otitis media type   [facility] [name], MD   1 year ago Gastroesophageal reflux disease, unspecified whether esophagitis present   [facility] [name], MD       Future Appointments             In 1 month [name], [name], MD [facility]

## 2022-01-29 NOTE — Telephone Encounter (Signed)
Requested medication (s) are due for refill today expired Rx  Requested medication (s) are on the active medication list -yes  Future visit scheduled -yes  Last refill: 01/21/21 18g 3RF  Notes to clinic: expired Rx  Requested Prescriptions  Pending Prescriptions Disp Refills   albuterol (VENTOLIN HFA) 108 (90 Base) MCG/ACT inhaler 18 g 3    Sig: INHALE 2 PUFFS BY MOUTH EVERY 6 HOURS AS NEEDED     Pulmonology:  Beta Agonists 2 Passed - 01/28/2022  5:57 PM      Passed - Last BP in normal range    BP Readings from Last 1 Encounters:  12/08/21 136/89         Passed - Last Heart Rate in normal range    Pulse Readings from Last 1 Encounters:  12/08/21 64         Passed - Valid encounter within last 12 months    Recent Outpatient Visits           1 month ago Morbid obesity [facility])   [facility] [name], MD   4 months ago Hyperglycemia   [facility] [name], MD   5 months ago Episodic mood disorder [facility])   [facility] Alfredia Ferguson, PA-C   1 year ago Chronic otitis media, unspecified otitis media type   [facility] [name], MD   1 year ago Gastroesophageal reflux disease, unspecified whether esophagitis present   [facility] [name], MD       Future Appointments             In 1 month [name], [name], MD [facility], [facility]               Requested Prescriptions  Pending Prescriptions Disp Refills   albuterol (VENTOLIN HFA) 108 (90 Base) MCG/ACT inhaler 18 g 3    Sig: INHALE 2 PUFFS BY MOUTH EVERY 6 HOURS AS NEEDED     Pulmonology:  Beta Agonists 2 Passed - 01/28/2022  5:57 PM      Passed - Last BP in normal range    BP Readings from Last 1 Encounters:  12/08/21 136/89         Passed - Last Heart Rate in normal range    Pulse Readings from Last 1 Encounters:  12/08/21 64         Passed - Valid encounter within last 12 months     Recent Outpatient Visits           1 month ago Morbid obesity [facility])   [facility] [name], MD   4 months ago Hyperglycemia   [facility] [name], MD   5 months ago Episodic mood disorder [facility])   [facility] Alfredia Ferguson, PA-C   1 year ago Chronic otitis media, unspecified otitis media type   [facility] [name], MD   1 year ago Gastroesophageal reflux disease, unspecified whether esophagitis present   [facility] [name], MD       Future Appointments             In 1 month [name], [name], MD [facility]

## 2022-01-31 ENCOUNTER — Encounter: Payer: Self-pay | Admitting: Family Medicine

## 2022-02-01 ENCOUNTER — Other Ambulatory Visit: Payer: Self-pay

## 2022-02-01 ENCOUNTER — Other Ambulatory Visit: Payer: Self-pay | Admitting: *Deleted

## 2022-02-01 DIAGNOSIS — F411 Generalized anxiety disorder: Secondary | ICD-10-CM

## 2022-02-01 MED ORDER — BUSPIRONE HCL 7.5 MG PO TABS
7.5000 mg | ORAL_TABLET | Freq: Two times a day (BID) | ORAL | 1 refills | Status: DC
  Filled 2022-02-01: qty 107, 27d supply, fill #0
  Filled 2022-02-01: qty 13, 3d supply, fill #0
  Filled 2022-03-14: qty 120, 30d supply, fill #1

## 2022-02-03 ENCOUNTER — Other Ambulatory Visit: Payer: Self-pay

## 2022-02-22 MED FILL — Valacyclovir HCl Tab 1 GM: ORAL | 8 days supply | Qty: 30 | Fill #1 | Status: AC

## 2022-02-22 MED FILL — Albuterol Sulfate Inhal Aero 108 MCG/ACT (90MCG Base Equiv): RESPIRATORY_TRACT | 25 days supply | Qty: 6.7 | Fill #1 | Status: AC

## 2022-02-23 ENCOUNTER — Other Ambulatory Visit: Payer: Self-pay

## 2022-03-12 ENCOUNTER — Ambulatory Visit: Payer: Self-pay | Admitting: Family Medicine

## 2022-03-12 NOTE — Progress Notes (Deleted)
Established patient visit   Patient: Todd Chen   DOB: 01-15-1976   46 y.o. Male  MRN: [id number] Visit Date: 03/12/2022  Today's healthcare provider: Mila Merry, MD   No chief complaint on file.  Subjective    HPI  Hypertension, follow-up  BP Readings from Last 3 Encounters:  12/08/21 136/89  09/28/21 (!) 147/95  08/26/21 (!) 137/105   Wt Readings from Last 3 Encounters:  12/08/21 300 lb (136.1 kg)  09/28/21 292 lb (132.5 kg)  08/26/21 280 lb 11.2 oz (127.3 kg)     He was last seen for hypertension 3 months ago.  Management since that visit includes; taking metoprolol 25 mg.  Outside blood pressures are {enter patient reported home BP, or 'not being checked':1}.  --------------------------------------------------------------------------------------------------- Anxiety, Follow-up  He was last seen for anxiety 3 months ago. Changes made at last visit include; refilled Alprazolam .   GAD-7 Results    12/08/2021    3:20 PM 08/26/2021   10:39 AM 07/31/2019   11:19 AM  GAD-7 Generalized Anxiety Disorder Screening Tool  1. Feeling Nervous, Anxious, or on Edge 1 3 1   2. Not Being Able to Stop or Control Worrying 1 3 3   3. Worrying Too Much About Different Things 1 3 3   4. Trouble Relaxing 1 2 1   5. Being So Restless it's Hard To Sit Still   0  6. Becoming Easily Annoyed or Irritable 2 1 1   7. Feeling Afraid As If Something Awful Might Happen 0 0 0  Total GAD-7 Score   9  Difficulty At Work, Home, or Getting  Along With Others? Somewhat difficult Somewhat difficult Somewhat difficult    PHQ-9 Scores    12/08/2021    3:14 PM 08/26/2021   10:03 AM 07/14/2020   10:03 AM  PHQ9 SCORE ONLY  PHQ-9 Total Score 13 8 1     ---------------------------------------------------------------------------------------------------    Medications: Outpatient Medications Prior to Visit  Medication Sig   albuterol (VENTOLIN HFA) 108 (90 Base) MCG/ACT inhaler INHALE 2  PUFFS BY MOUTH EVERY 6 HOURS AS NEEDED   ALPRAZolam (XANAX) 1 MG tablet TAKE 1 TABLET BY MOUTH TWO TO THREE TIMES DAILY AS NEEDED FOR ANXIETY   busPIRone (BUSPAR) 7.5 MG tablet Take 1-2 tablets (7.5-15 mg total) by mouth 2 (two) times daily.   metoprolol succinate (TOPROL-XL) 50 MG 24 hr tablet TAKE 1 TABLET BY MOUTH DAILY. TAKE WITH OR IMMEDIATELY FOLLOWING A MEAL.   Mometasone Furoate (ASMANEX HFA) 100 MCG/ACT AERO Inhale 2 puffs into the lungs 2 (two) times daily.   omeprazole (PRILOSEC) 40 MG capsule One capsule each 30 minutes prior to main meal of day   pravastatin (PRAVACHOL) 40 MG tablet TAKE 1 TABLET BY MOUTH DAILY. (Patient not taking: Reported on 12/08/2021)   sucralfate (CARAFATE) 1 g tablet TAKE ONE TABLET BY MOUTH FOUR TIMES A DAY WITH MEALS AND AT BEDTIME AS NEEDED   triamcinolone (NASACORT) 55 MCG/ACT AERO nasal inhaler 2 sprays each nostril qday   triamcinolone (NASACORT) 55 MCG/ACT AERO nasal inhaler USE 2 SPRAYS IN EACH NOSTRIL EVERY DAY   valACYclovir (VALTREX) 1000 MG tablet Take 2 tablets (2,000 mg total) by mouth 2 (two) times daily.   No facility-administered medications prior to visit.    Review of Systems  Constitutional:  Negative for appetite change, chills and fever.  Respiratory:  Negative for chest tightness, shortness of breath and wheezing.   Cardiovascular:  Negative for chest pain and palpitations.  Gastrointestinal:  Negative for abdominal pain, nausea and vomiting.    {Labs  Heme  Chem  Endocrine  Serology  Results Review (optional):23779}   Objective    There were no vitals taken for this visit. {Show previous vital signs (optional):23777}  Physical Exam  ***  No results found for any visits on 03/12/22.  Assessment & Plan     ***  No follow-ups on file.      {provider attestation***:1}   Mila Merry, MD  [address] [phone] (phone) [phone] (fax)  [facility]

## 2022-03-14 ENCOUNTER — Other Ambulatory Visit: Payer: Self-pay | Admitting: Family Medicine

## 2022-03-14 DIAGNOSIS — F411 Generalized anxiety disorder: Secondary | ICD-10-CM

## 2022-03-15 ENCOUNTER — Other Ambulatory Visit: Payer: Self-pay

## 2022-03-16 ENCOUNTER — Other Ambulatory Visit: Payer: Self-pay

## 2022-03-16 ENCOUNTER — Other Ambulatory Visit: Payer: Self-pay | Admitting: Family Medicine

## 2022-03-16 ENCOUNTER — Ambulatory Visit: Payer: Self-pay | Admitting: Family Medicine

## 2022-03-16 DIAGNOSIS — F411 Generalized anxiety disorder: Secondary | ICD-10-CM

## 2022-03-16 NOTE — Telephone Encounter (Signed)
Requested medication (s) are due for refill today: yes   Requested medication (s) are on the active medication list: yes   Last refill:  12/08/21 #75 with 3 refills   Future visit scheduled: no   Notes to clinic:  Please review for refill. Refill not delegated per protocol.     Requested Prescriptions  Pending Prescriptions Disp Refills   ALPRAZolam (XANAX) 1 MG tablet 75 tablet 3    Sig: TAKE 1 TABLET BY MOUTH TWO TO THREE TIMES DAILY AS NEEDED FOR ANXIETY     Not Delegated - Psychiatry: Anxiolytics/Hypnotics 2 Failed - 03/16/2022 11:12 AM      Failed - This refill cannot be delegated      Failed - Urine Drug Screen completed in last 360 days      Passed - Patient is not pregnant      Passed - Valid encounter within last 6 months    Recent Outpatient Visits           3 months ago Morbid obesity [facility])   [facility] [name], MD   5 months ago Hyperglycemia   [facility] [name], MD   6 months ago Episodic mood disorder [facility])   [facility] Alfredia Ferguson, PA-C   1 year ago Chronic otitis media, unspecified otitis media type   [facility] [name], MD   1 year ago Gastroesophageal reflux disease, unspecified whether esophagitis present   [facility] [name], MD

## 2022-03-16 NOTE — Progress Notes (Deleted)
Established patient visit   Patient: Todd Chen   DOB: Aug 19, 1975   46 y.o. Male  MRN: [id number] Visit Date: 03/16/2022  Today's healthcare provider: Mila Merry, MD   No chief complaint on file.  Subjective    HPI  Anxiety, Follow-up  He was last seen for anxiety 3 months ago. Changes made at last visit include no changes.   GAD-7 Results    12/08/2021    3:20 PM 08/26/2021   10:39 AM 07/31/2019   11:19 AM  GAD-7 Generalized Anxiety Disorder Screening Tool  1. Feeling Nervous, Anxious, or on Edge 1 3 1   2. Not Being Able to Stop or Control Worrying 1 3 3   3. Worrying Too Much About Different Things 1 3 3   4. Trouble Relaxing 1 2 1   5. Being So Restless it's Hard To Sit Still   0  6. Becoming Easily Annoyed or Irritable 2 1 1   7. Feeling Afraid As If Something Awful Might Happen 0 0 0  Total GAD-7 Score   9  Difficulty At Work, Home, or Getting  Along With Others? Somewhat difficult Somewhat difficult Somewhat difficult    PHQ-9 Scores    12/08/2021    3:14 PM 08/26/2021   10:03 AM 07/14/2020   10:03 AM  PHQ9 SCORE ONLY  PHQ-9 Total Score 13 8 1     ---------------------------------------------------------------------------------------------------   Medications: Outpatient Medications Prior to Visit  Medication Sig   albuterol (VENTOLIN HFA) 108 (90 Base) MCG/ACT inhaler INHALE 2 PUFFS BY MOUTH EVERY 6 HOURS AS NEEDED   ALPRAZolam (XANAX) 1 MG tablet TAKE 1 TABLET BY MOUTH TWO TO THREE TIMES DAILY AS NEEDED FOR ANXIETY   busPIRone (BUSPAR) 7.5 MG tablet Take 1-2 tablets (7.5-15 mg total) by mouth 2 (two) times daily.   metoprolol succinate (TOPROL-XL) 50 MG 24 hr tablet TAKE 1 TABLET BY MOUTH DAILY. TAKE WITH OR IMMEDIATELY FOLLOWING A MEAL.   Mometasone Furoate (ASMANEX HFA) 100 MCG/ACT AERO Inhale 2 puffs into the lungs 2 (two) times daily.   omeprazole (PRILOSEC) 40 MG capsule One capsule each 30 minutes prior to main meal of day   pravastatin  (PRAVACHOL) 40 MG tablet TAKE 1 TABLET BY MOUTH DAILY. (Patient not taking: Reported on 12/08/2021)   sucralfate (CARAFATE) 1 g tablet TAKE ONE TABLET BY MOUTH FOUR TIMES A DAY WITH MEALS AND AT BEDTIME AS NEEDED   triamcinolone (NASACORT) 55 MCG/ACT AERO nasal inhaler 2 sprays each nostril qday   triamcinolone (NASACORT) 55 MCG/ACT AERO nasal inhaler USE 2 SPRAYS IN EACH NOSTRIL EVERY DAY   valACYclovir (VALTREX) 1000 MG tablet Take 2 tablets (2,000 mg total) by mouth 2 (two) times daily.   No facility-administered medications prior to visit.    Review of Systems  Constitutional:  Negative for appetite change, chills and fever.  Respiratory:  Negative for chest tightness, shortness of breath and wheezing.   Cardiovascular:  Negative for chest pain and palpitations.  Gastrointestinal:  Negative for abdominal pain, nausea and vomiting.   {Labs  Heme  Chem  Endocrine  Serology  Results Review (optional):23779}   Objective    There were no vitals taken for this visit. {Show previous vital signs (optional):23777}  Physical Exam  ***  No results found for any visits on 03/16/22.  Assessment & Plan     ***  No follow-ups on file.      {provider attestation***:1}   10/26/2021, MD  [facility] [phone] (phone)  [phone] (fax)  [place]

## 2022-03-17 ENCOUNTER — Other Ambulatory Visit: Payer: Self-pay

## 2022-03-17 MED FILL — Alprazolam Tab 1 MG: ORAL | 8 days supply | Qty: 30 | Fill #0 | Status: AC

## 2022-03-18 ENCOUNTER — Other Ambulatory Visit: Payer: Self-pay

## 2022-03-23 ENCOUNTER — Other Ambulatory Visit: Payer: Self-pay | Admitting: Physician Assistant

## 2022-03-23 DIAGNOSIS — I10 Essential (primary) hypertension: Secondary | ICD-10-CM

## 2022-03-24 ENCOUNTER — Telehealth: Payer: Self-pay | Admitting: Family Medicine

## 2022-03-24 ENCOUNTER — Other Ambulatory Visit: Payer: Self-pay

## 2022-03-24 MED ORDER — METOPROLOL SUCCINATE ER 50 MG PO TB24
ORAL_TABLET | Freq: Every day | ORAL | 1 refills | Status: DC
  Filled 2022-03-24: qty 90, 90d supply, fill #0

## 2022-03-24 NOTE — Telephone Encounter (Signed)
Lmtcb to let us know what lab he will be using.

## 2022-03-24 NOTE — Telephone Encounter (Signed)
Patient is coming in on 04/19/22 and wanted to now if he needs to get labs done before his appt.   He gets them done with his "Magazine" because its a lot cheaper.    So please let patient know if he needs to get the labs drawn.

## 2022-03-24 NOTE — Telephone Encounter (Signed)
Yes, he needs a lipid panel and a completely metabolic panel.

## 2022-03-25 NOTE — Telephone Encounter (Signed)
Patient reports he will get labs done at his wife's business. He does not need a lab order. He will fax over results.

## 2022-03-26 ENCOUNTER — Other Ambulatory Visit: Payer: Self-pay | Admitting: Family Medicine

## 2022-03-26 ENCOUNTER — Other Ambulatory Visit: Payer: Self-pay

## 2022-03-26 DIAGNOSIS — F411 Generalized anxiety disorder: Secondary | ICD-10-CM

## 2022-03-26 MED FILL — Valacyclovir HCl Tab 1 GM: ORAL | 8 days supply | Qty: 30 | Fill #2 | Status: AC

## 2022-03-29 ENCOUNTER — Other Ambulatory Visit: Payer: Self-pay

## 2022-03-29 NOTE — Telephone Encounter (Signed)
Requested medications are due for refill today.  unsure  Requested medications are on the active medications list.  yes  Last refill. 03/17/2022 #30 0 rf  Future visit scheduled.   yes  Notes to clinic.  Refill not delegated.    Requested Prescriptions  Pending Prescriptions Disp Refills   ALPRAZolam (XANAX) 1 MG tablet 30 tablet 0    Sig: TAKE 1 TABLET BY MOUTH TWO TO THREE TIMES DAILY AS NEEDED FOR ANXIETY     Not Delegated - Psychiatry: Anxiolytics/Hypnotics 2 Failed - 03/26/2022  5:43 PM      Failed - This refill cannot be delegated      Failed - Urine Drug Screen completed in last 360 days      Passed - Patient is not pregnant      Passed - Valid encounter within last 6 months    Recent Outpatient Visits           3 months ago Morbid obesity [facility])   [facility] Birdie Sons, MD   6 months ago Hyperglycemia   [facility] Birdie Sons, MD   7 months ago Episodic mood disorder [facility])   [facility] Mikey Kirschner, PA-C   1 year ago Chronic otitis media, unspecified otitis media type   [facility] Birdie Sons, MD   1 year ago Gastroesophageal reflux disease, unspecified whether esophagitis present   [facility] Birdie Sons, MD       Future Appointments             In 3 weeks [name], Kirstie Peri, MD [facility], [place]

## 2022-03-30 ENCOUNTER — Other Ambulatory Visit: Payer: Self-pay

## 2022-03-30 MED FILL — Alprazolam Tab 1 MG: ORAL | 10 days supply | Qty: 30 | Fill #0 | Status: AC

## 2022-04-08 ENCOUNTER — Other Ambulatory Visit: Payer: Self-pay | Admitting: Family Medicine

## 2022-04-08 DIAGNOSIS — F411 Generalized anxiety disorder: Secondary | ICD-10-CM

## 2022-04-08 DIAGNOSIS — E782 Mixed hyperlipidemia: Secondary | ICD-10-CM

## 2022-04-08 MED FILL — Albuterol Sulfate Inhal Aero 108 MCG/ACT (90MCG Base Equiv): RESPIRATORY_TRACT | 25 days supply | Qty: 6.7 | Fill #2 | Status: AC

## 2022-04-08 MED FILL — Valacyclovir HCl Tab 1 GM: ORAL | 8 days supply | Qty: 30 | Fill #3 | Status: AC

## 2022-04-09 ENCOUNTER — Other Ambulatory Visit: Payer: Self-pay

## 2022-04-09 ENCOUNTER — Other Ambulatory Visit: Payer: Self-pay | Admitting: Family Medicine

## 2022-04-09 DIAGNOSIS — E782 Mixed hyperlipidemia: Secondary | ICD-10-CM

## 2022-04-09 DIAGNOSIS — F411 Generalized anxiety disorder: Secondary | ICD-10-CM

## 2022-04-09 MED FILL — Pravastatin Sodium Tab 40 MG: ORAL | 90 days supply | Qty: 90 | Fill #0 | Status: CN

## 2022-04-09 NOTE — Telephone Encounter (Signed)
Requested medication (s) are due for refill today: yes  Requested medication (s) are on the active medication list: yes  Last refill:  03/30/22 #30  Future visit scheduled: yes  Notes to clinic:  med not delegated to NT to reorder   Requested Prescriptions  Pending Prescriptions Disp Refills   ALPRAZolam (XANAX) 1 MG tablet 30 tablet     Sig: TAKE 1 TABLET BY MOUTH TWO TO THREE TIMES DAILY AS NEEDED FOR ANXIETY     Not Delegated - Psychiatry: Anxiolytics/Hypnotics 2 Failed - 04/09/2022  8:46 AM      Failed - This refill cannot be delegated      Failed - Urine Drug Screen completed in last 360 days      Passed - Patient is not pregnant      Passed - Valid encounter within last 6 months    Recent Outpatient Visits           4 months ago Morbid obesity ([place])   [place], Donald E, MD   6 months ago Hyperglycemia   [place], Donald E, MD   7 months ago Episodic mood disorder [facility])   [place] Mikey Kirschner, PA-C   1 year ago Chronic otitis media, unspecified otitis media type   [facility] Birdie Sons, MD   1 year ago Gastroesophageal reflux disease, unspecified whether esophagitis present   [place], MD       Future Appointments             In 1 week [name], Kirstie Peri, MD [facility]            Signed Prescriptions Disp Refills   pravastatin (PRAVACHOL) 40 MG tablet 90 tablet 1    Sig: TAKE 1 TABLET BY MOUTH DAILY.     Cardiovascular:  Antilipid - Statins Failed - 04/09/2022  8:46 AM      Failed - Lipid Panel in normal range within the last 12 months    Cholesterol, Total  Date Value Ref Range Status  07/14/2020 197 100 - 199 mg/dL Final   Cholesterol  Date Value Ref Range Status  08/27/2021 192 0 - 200 Final   LDL Chol Calc (NIH)  Date Value Ref  Range Status  07/14/2020 90 0 - 99 mg/dL Final   LDL Cholesterol  Date Value Ref Range Status  08/27/2021 119  Final   HDL  Date Value Ref Range Status  08/27/2021 27 (A) 35 - 70 Final  07/14/2020 33 (L) >39 mg/dL Final   Triglycerides  Date Value Ref Range Status  08/27/2021 23 (A) 40 - 160 Final         Passed - Patient is not pregnant      Passed - Valid encounter within last 12 months    Recent Outpatient Visits           4 months ago Morbid obesity ([place])   [place], Donald E, MD   6 months ago Hyperglycemia   [place], Donald E, MD   7 months ago Episodic mood disorder [facility])   [place] Mikey Kirschner, PA-C   1 year ago Chronic otitis media, unspecified otitis media type   [facility] Birdie Sons, MD   1 year ago Gastroesophageal reflux  disease, unspecified whether esophagitis present   [place], MD       Future Appointments             In 1 week [name], Kirstie Peri, MD [facility], [facility]

## 2022-04-09 NOTE — Telephone Encounter (Signed)
Requested Prescriptions  Pending Prescriptions Disp Refills   ALPRAZolam (XANAX) 1 MG tablet 30 tablet     Sig: TAKE 1 TABLET BY MOUTH TWO TO THREE TIMES DAILY AS NEEDED FOR ANXIETY     Not Delegated - Psychiatry: Anxiolytics/Hypnotics 2 Failed - 04/09/2022  8:46 AM      Failed - This refill cannot be delegated      Failed - Urine Drug Screen completed in last 360 days      Passed - Patient is not pregnant      Passed - Valid encounter within last 6 months    Recent Outpatient Visits           4 months ago Morbid obesity ([place])   [place], Donald E, MD   6 months ago Hyperglycemia   [place], Donald E, MD   7 months ago Episodic mood disorder [facility])   [place] Mikey Kirschner, PA-C   1 year ago Chronic otitis media, unspecified otitis media type   [facility] Birdie Sons, MD   1 year ago Gastroesophageal reflux disease, unspecified whether esophagitis present   [place], MD       Future Appointments             In 1 week Caryn Section, Kirstie Peri, MD [place], [facility]             pravastatin (PRAVACHOL) 40 MG tablet 90 tablet 1    Sig: TAKE 1 TABLET BY MOUTH DAILY.     Cardiovascular:  Antilipid - Statins Failed - 04/09/2022  8:46 AM      Failed - Lipid Panel in normal range within the last 12 months    Cholesterol, Total  Date Value Ref Range Status  07/14/2020 197 100 - 199 mg/dL Final   Cholesterol  Date Value Ref Range Status  08/27/2021 192 0 - 200 Final   LDL Chol Calc (NIH)  Date Value Ref Range Status  07/14/2020 90 0 - 99 mg/dL Final   LDL Cholesterol  Date Value Ref Range Status  08/27/2021 119  Final   HDL  Date Value Ref Range Status  08/27/2021 27 (A) 35 - 70 Final  07/14/2020 33 (L) >39 mg/dL Final   Triglycerides  Date Value Ref Range Status   08/27/2021 23 (A) 40 - 160 Final         Passed - Patient is not pregnant      Passed - Valid encounter within last 12 months    Recent Outpatient Visits           4 months ago Morbid obesity ([place])   [place], Donald E, MD   6 months ago Hyperglycemia   [place], Donald E, MD   7 months ago Episodic mood disorder [facility])   [place] Mikey Kirschner, PA-C   1 year ago Chronic otitis media, unspecified otitis media type   [facility] Birdie Sons, MD   1 year ago Gastroesophageal reflux disease, unspecified whether esophagitis present   [place], MD       Future Appointments             In 1 week [name], Kirstie Peri, MD [facility], [facility]

## 2022-04-12 ENCOUNTER — Telehealth: Payer: Self-pay | Admitting: Family Medicine

## 2022-04-12 DIAGNOSIS — F411 Generalized anxiety disorder: Secondary | ICD-10-CM

## 2022-04-12 NOTE — Telephone Encounter (Signed)
Medication Refill - Medication: Rx #: [id number]  ALPRAZolam (XANAX) 1 MG tablet [[id number]]  Pt is calling to request refill until appt next Monday. Pt states that he has one pill left  Has the patient contacted their pharmacy? Yes.   (Agent: If no, request that the patient contact the pharmacy for the refill. If patient does not wish to contact the pharmacy document the reason why and proceed with request.) (Agent: If yes, when and what did the pharmacy advise?)  Preferred Pharmacy (with phone number or street name): [facility] [address], [place] [address] Phone: [phone]  Fax: [phone] DEA #: [id number]   Has the patient been seen for an appointment in the last year OR does the patient have an upcoming appointment? Yes.  04/19/21  Agent: Please be advised that RX refills may take up to 3 business days. We ask that you follow-up with your pharmacy.

## 2022-04-13 ENCOUNTER — Other Ambulatory Visit: Payer: Self-pay

## 2022-04-13 MED ORDER — ALPRAZOLAM 1 MG PO TABS
1.0000 mg | ORAL_TABLET | Freq: Two times a day (BID) | ORAL | 0 refills | Status: DC
  Filled 2022-04-13: qty 18, 6d supply, fill #0

## 2022-04-17 ENCOUNTER — Other Ambulatory Visit: Payer: Self-pay

## 2022-04-19 ENCOUNTER — Encounter: Payer: Self-pay | Admitting: Family Medicine

## 2022-04-19 ENCOUNTER — Ambulatory Visit (INDEPENDENT_AMBULATORY_CARE_PROVIDER_SITE_OTHER): Payer: Self-pay | Admitting: Family Medicine

## 2022-04-19 ENCOUNTER — Other Ambulatory Visit: Payer: Self-pay

## 2022-04-19 VITALS — BP 148/105 | Temp 98.3°F | Ht 68.0 in | Wt 302.0 lb

## 2022-04-19 DIAGNOSIS — Z720 Tobacco use: Secondary | ICD-10-CM

## 2022-04-19 DIAGNOSIS — F411 Generalized anxiety disorder: Secondary | ICD-10-CM

## 2022-04-19 DIAGNOSIS — E782 Mixed hyperlipidemia: Secondary | ICD-10-CM

## 2022-04-19 DIAGNOSIS — I10 Essential (primary) hypertension: Secondary | ICD-10-CM

## 2022-04-19 MED ORDER — ALPRAZOLAM 1 MG PO TABS
1.0000 mg | ORAL_TABLET | Freq: Two times a day (BID) | ORAL | 1 refills | Status: DC
  Filled 2022-04-19: qty 90, 30d supply, fill #0
  Filled 2022-05-11 – 2022-05-17 (×2): qty 90, 30d supply, fill #1

## 2022-04-19 MED ORDER — METOPROLOL SUCCINATE ER 50 MG PO TB24: 75.0000 mg | ORAL_TABLET | Freq: Every day | ORAL | Status: DC

## 2022-04-19 NOTE — Progress Notes (Signed)
Established patient visit   Patient: Todd Chen   DOB: 1975/11/21   47 y.o. Male  MRN: [id number] Visit Date: 04/19/2022  Today's healthcare provider: Lelon Huh, MD   Chief Complaint  Patient presents with   Hypertension   Hyperlipidemia   Anxiety   Subjective    Hypertension Associated symptoms include anxiety. Pertinent negatives include no chest pain, palpitations or shortness of breath.  Hyperlipidemia Pertinent negatives include no chest pain or shortness of breath.  Anxiety Patient reports no chest pain, nausea, palpitations or shortness of breath.     Presents today for follow up of anxiety. Has been taking Buspar and states he has stopped drinking alcohol since he started it,but states it makes him feel weird. He briefly titrated up to 2 tablets, but went back down to 1 due to side effects. He usually takes one every morning and sometimes one in the afternoon. He only takes alprazolam at bedtime and states most of anxiety symptoms are now due to worrying about being able to fall asleep. He typically takes 3 xanax around 10 oclock and night. Has tried cutting back to 2 but is  up all night.   He does reports having more headaches lately. They are generalized global headaches with no neurologic symptoms.    Medications: Outpatient Medications Prior to Visit  Medication Sig   albuterol (VENTOLIN HFA) 108 (90 Base) MCG/ACT inhaler INHALE 2 PUFFS BY MOUTH EVERY 6 HOURS AS NEEDED   ALPRAZolam (XANAX) 1 MG tablet Take 1 tablet (1 mg total) by mouth 2 (two) to 3 (three) times daily as needed   busPIRone (BUSPAR) 7.5 MG tablet Take 1-2 tablets (7.5-15 mg total) by mouth 2 (two) times daily.   metoprolol succinate (TOPROL-XL) 50 MG 24 hr tablet TAKE 1 TABLET BY MOUTH DAILY. TAKE WITH OR IMMEDIATELY FOLLOWING A MEAL.   Mometasone Furoate (ASMANEX HFA) 100 MCG/ACT AERO Inhale 2 puffs into the lungs 2 (two) times daily.   omeprazole (PRILOSEC) 40 MG capsule One  capsule each 30 minutes prior to main meal of day   sucralfate (CARAFATE) 1 g tablet TAKE ONE TABLET BY MOUTH FOUR TIMES A DAY WITH MEALS AND AT BEDTIME AS NEEDED   triamcinolone (NASACORT) 55 MCG/ACT AERO nasal inhaler 2 sprays each nostril qday   triamcinolone (NASACORT) 55 MCG/ACT AERO nasal inhaler USE 2 SPRAYS IN EACH NOSTRIL EVERY DAY   valACYclovir (VALTREX) 1000 MG tablet Take 2 tablets (2,000 mg total) by mouth 2 (two) times daily.   [DISCONTINUED] pravastatin (PRAVACHOL) 40 MG tablet Take 1 tablet (40 mg total) by mouth daily.   No facility-administered medications prior to visit.    Review of Systems  Constitutional:  Negative for appetite change, chills and fever.  Respiratory:  Negative for chest tightness, shortness of breath and wheezing.   Cardiovascular:  Negative for chest pain and palpitations.  Gastrointestinal:  Negative for abdominal pain, nausea and vomiting.       Objective    BP (!) 148/105 (BP Location: Right Arm, Patient Position: Sitting, Cuff Size: Large)   Temp 98.3 F (36.8 C)   Ht 5\' 8"  (1.727 m)   Wt (!) 302 lb (137 kg)   SpO2 97%   BMI 45.92 kg/m    Physical Exam   General: Appearance:    Severely obese male in no acute distress  Eyes:    PERRL, conjunctiva/corneas clear, EOM's intact       Lungs:     Clear  to auscultation bilaterally, respirations unlabored  Heart:    Normal heart rate. Normal rhythm. No murmurs, rubs, or gallops.    MS:   All extremities are intact.    Neurologic:   Awake, alert, oriented x 3. No apparent focal neurological defect.         Assessment & Plan     1. Anxiety, generalized Has stopped drinking alcohol since starting buspirone, but states it makes feel a little weird. Will reduce buspirone by 1/2 tablet a day. Anxiety mostly centered around difficulty falling asleep. He can continue same alprazolam regiment for the time being.  - ALPRAZolam (XANAX) 1 MG tablet; Take 1 tablet (1 mg total) by mouth 2 (two) to 3  (three) times daily as needed  Dispense: 90 tablet; Refill: 1  2. Essential hypertension Not at goal. Increase  metoprolol succinate (TOPROL-XL) 50 MG 24 hr tablet; to take 1.5 tablets (75 mg total) by mouth daily.  3. Morbid obesity ([place]) Recommend he consider a GLP-1 agonist. He does not currently have insurance but will consider once he dose.   4. Hyperlipidemia, mixed Now off of pravastatin which he states made him hungry all the time.  - Comprehensive metabolic panel - Lipid panel  5. Tobacco abuse Encourage smoking cessation.       The entirety of the information documented in the History of Present Illness, Review of Systems and Physical Exam were personally obtained by me. Portions of this information were initially documented by the CMA and reviewed by me for thoroughness and accuracy.     Lelon Huh, MD  [place] [phone] (phone) [phone] (fax)  [place]

## 2022-04-19 NOTE — Patient Instructions (Addendum)
Please review the attached list of medications and notify my office if there are any errors.   I recommend looking into getting a prescription for Wegovy or Zepbound which helps suppress craving for food, sweets drinks, and smoking  Increase metoprolol to 1 1/2 tablets every day  Reduce buspirone by 1/2 tablet every day

## 2022-04-22 LAB — VITAMIN D 25 HYDROXY (VIT D DEFICIENCY, FRACTURES): Vit D, 25-Hydroxy: 15.9

## 2022-04-23 ENCOUNTER — Other Ambulatory Visit: Payer: Self-pay

## 2022-05-11 ENCOUNTER — Other Ambulatory Visit: Payer: Self-pay | Admitting: Family Medicine

## 2022-05-11 ENCOUNTER — Other Ambulatory Visit: Payer: Self-pay

## 2022-05-11 DIAGNOSIS — F411 Generalized anxiety disorder: Secondary | ICD-10-CM

## 2022-05-11 DIAGNOSIS — I10 Essential (primary) hypertension: Secondary | ICD-10-CM

## 2022-05-11 MED ORDER — METOPROLOL SUCCINATE ER 50 MG PO TB24: 75.0000 mg | ORAL_TABLET | Freq: Every day | ORAL | 1 refills | Status: DC

## 2022-05-11 MED ORDER — METOPROLOL SUCCINATE ER 50 MG PO TB24: 75.0000 mg | ORAL_TABLET | Freq: Every day | ORAL | Status: DC

## 2022-05-11 NOTE — Addendum Note (Signed)
Addended by: Birdie Sons on: 05/11/2022 09:56 AM   Modules accepted: Orders

## 2022-05-12 ENCOUNTER — Other Ambulatory Visit: Payer: Self-pay

## 2022-05-12 MED ORDER — BUSPIRONE HCL 7.5 MG PO TABS
7.5000 mg | ORAL_TABLET | Freq: Two times a day (BID) | ORAL | 1 refills | Status: DC
  Filled 2022-05-12: qty 120, 30d supply, fill #0
  Filled 2022-06-12: qty 120, 30d supply, fill #1

## 2022-05-17 ENCOUNTER — Other Ambulatory Visit: Payer: Self-pay

## 2022-05-18 ENCOUNTER — Other Ambulatory Visit: Payer: Self-pay

## 2022-05-20 ENCOUNTER — Encounter: Payer: Self-pay | Admitting: Family Medicine

## 2022-05-20 DIAGNOSIS — E559 Vitamin D deficiency, unspecified: Secondary | ICD-10-CM

## 2022-05-20 DIAGNOSIS — E782 Mixed hyperlipidemia: Secondary | ICD-10-CM

## 2022-05-20 DIAGNOSIS — I10 Essential (primary) hypertension: Secondary | ICD-10-CM

## 2022-05-21 ENCOUNTER — Other Ambulatory Visit: Payer: Self-pay

## 2022-05-21 DIAGNOSIS — E559 Vitamin D deficiency, unspecified: Secondary | ICD-10-CM | POA: Insufficient documentation

## 2022-05-21 MED ORDER — METOPROLOL SUCCINATE ER 50 MG PO TB24
75.0000 mg | ORAL_TABLET | Freq: Every day | ORAL | 2 refills | Status: DC
  Filled 2022-05-21 – 2022-06-02 (×3): qty 135, 90d supply, fill #0
  Filled 2022-08-25: qty 135, 90d supply, fill #1
  Filled 2022-12-06: qty 135, 90d supply, fill #2

## 2022-05-21 MED ORDER — LOVASTATIN 20 MG PO TABS
20.0000 mg | ORAL_TABLET | Freq: Every day | ORAL | 2 refills | Status: DC
  Filled 2022-05-21: qty 90, 90d supply, fill #0
  Filled 2022-08-25: qty 90, 90d supply, fill #1
  Filled 2023-01-02: qty 90, 90d supply, fill #2

## 2022-06-01 ENCOUNTER — Other Ambulatory Visit: Payer: Self-pay

## 2022-06-02 ENCOUNTER — Other Ambulatory Visit: Payer: Self-pay

## 2022-06-08 ENCOUNTER — Other Ambulatory Visit: Payer: Self-pay | Admitting: Family Medicine

## 2022-06-08 DIAGNOSIS — F411 Generalized anxiety disorder: Secondary | ICD-10-CM

## 2022-06-10 ENCOUNTER — Other Ambulatory Visit: Payer: Self-pay

## 2022-06-10 ENCOUNTER — Other Ambulatory Visit: Payer: Self-pay | Admitting: Family Medicine

## 2022-06-10 DIAGNOSIS — F411 Generalized anxiety disorder: Secondary | ICD-10-CM

## 2022-06-10 MED FILL — Alprazolam Tab 1 MG: ORAL | 45 days supply | Qty: 90 | Fill #0 | Status: CN

## 2022-06-10 NOTE — Telephone Encounter (Signed)
Requested medication (s) are due for refill today - yes  Requested medication (s) are on the active medication list -yes  Future visit scheduled -yes  Last refill: 04/19/22 #90 1RF  Notes to clinic: non delegated Rx  Requested Prescriptions  Pending Prescriptions Disp Refills   ALPRAZolam (XANAX) 1 MG tablet 90 tablet 1    Sig: Take 1 tablet (1 mg total) by mouth 2 (two) to 3 (three) times daily as needed     Not Delegated - Psychiatry: Anxiolytics/Hypnotics 2 Failed - 06/10/2022 10:39 AM      Failed - This refill cannot be delegated      Failed - Urine Drug Screen completed in last 360 days      Passed - Patient is not pregnant      Passed - Valid encounter within last 6 months    Recent Outpatient Visits           1 month ago Anxiety, generalized   [place], Donald E, MD   6 months ago Morbid obesity [facility])   [place], Donald E, MD   8 months ago Hyperglycemia   [place], Donald E, MD   9 months ago Episodic mood disorder [facility])   [place] Mikey Kirschner, PA-C   1 year ago Chronic otitis media, unspecified otitis media type   [facility] Birdie Sons, MD                 Requested Prescriptions  Pending Prescriptions Disp Refills   ALPRAZolam (XANAX) 1 MG tablet 90 tablet 1    Sig: Take 1 tablet (1 mg total) by mouth 2 (two) to 3 (three) times daily as needed     Not Delegated - Psychiatry: Anxiolytics/Hypnotics 2 Failed - 06/10/2022 10:39 AM      Failed - This refill cannot be delegated      Failed - Urine Drug Screen completed in last 360 days      Passed - Patient is not pregnant      Passed - Valid encounter within last 6 months    Recent Outpatient Visits           1 month ago Anxiety, generalized   [place], Donald E, MD   6 months ago Morbid  obesity [facility])   [place], Donald E, MD   8 months ago Hyperglycemia   [place], Donald E, MD   9 months ago Episodic mood disorder [facility])   [place] Mikey Kirschner, PA-C   1 year ago Chronic otitis media, unspecified otitis media type   [facility] Birdie Sons, MD

## 2022-06-12 ENCOUNTER — Other Ambulatory Visit: Payer: Self-pay | Admitting: Family Medicine

## 2022-06-12 MED FILL — Alprazolam Tab 1 MG: ORAL | 45 days supply | Qty: 90 | Fill #0 | Status: AC

## 2022-06-12 MED FILL — Albuterol Sulfate Inhal Aero 108 MCG/ACT (90MCG Base Equiv): RESPIRATORY_TRACT | 25 days supply | Qty: 6.7 | Fill #3 | Status: AC

## 2022-06-13 ENCOUNTER — Other Ambulatory Visit: Payer: Self-pay

## 2022-06-13 ENCOUNTER — Other Ambulatory Visit: Payer: Self-pay | Admitting: Family Medicine

## 2022-06-14 ENCOUNTER — Other Ambulatory Visit: Payer: Self-pay

## 2022-06-14 MED FILL — Valacyclovir HCl Tab 1 GM: ORAL | 30 days supply | Qty: 120 | Fill #0 | Status: CN

## 2022-06-14 NOTE — Telephone Encounter (Signed)
Requested Prescriptions  Pending Prescriptions Disp Refills   valACYclovir (VALTREX) 1000 MG tablet 360 tablet 0    Sig: Take 2 tablets (2,000 mg total) by mouth 2 (two) times daily.     Antimicrobials:  Antiviral Agents - Anti-Herpetic Passed - 06/13/2022 10:16 PM      Passed - Valid encounter within last 12 months    Recent Outpatient Visits           1 month ago Anxiety, generalized   [place], Donald E, MD   6 months ago Morbid obesity [facility])   [place] Birdie Sons, MD   8 months ago Hyperglycemia   [facility] Birdie Sons, MD   9 months ago Episodic mood disorder [facility])   [place] Mikey Kirschner, PA-C   1 year ago Chronic otitis media, unspecified otitis media type   [facility] Birdie Sons, MD

## 2022-06-15 ENCOUNTER — Other Ambulatory Visit: Payer: Self-pay

## 2022-06-29 ENCOUNTER — Other Ambulatory Visit: Payer: Self-pay

## 2022-07-07 ENCOUNTER — Other Ambulatory Visit: Payer: Self-pay | Admitting: Family Medicine

## 2022-07-07 DIAGNOSIS — F411 Generalized anxiety disorder: Secondary | ICD-10-CM

## 2022-07-07 MED FILL — Valacyclovir HCl Tab 1 GM: ORAL | 30 days supply | Qty: 120 | Fill #0 | Status: AC

## 2022-07-08 ENCOUNTER — Other Ambulatory Visit: Payer: Self-pay

## 2022-07-08 NOTE — Telephone Encounter (Signed)
Requested medication (s) are due for refill today: yes  Requested medication (s) are on the active medication list: yes  Last refill:  06/10/22 #90/0  Future visit scheduled: no  Notes to clinic:  Unable to refill per protocol, cannot delegate. Pt LOV 04/19/22   Requested Prescriptions  Pending Prescriptions Disp Refills   ALPRAZolam (XANAX) 1 MG tablet 90 tablet 0    Sig: Take 1 tablet (1 mg total) by mouth 2 (two) to 3 (three) times daily as needed     Not Delegated - Psychiatry: Anxiolytics/Hypnotics 2 Failed - 07/07/2022  8:43 PM      Failed - This refill cannot be delegated      Failed - Urine Drug Screen completed in last 360 days      Passed - Patient is not pregnant      Passed - Valid encounter within last 6 months    Recent Outpatient Visits           2 months ago Anxiety, generalized   [place] [facility] [name], MD   7 months ago Morbid obesity [facility])   [place] [facility] [name], MD   9 months ago Hyperglycemia   [facility] [name], MD   10 months ago Episodic mood disorder [facility])   [facility] Alfredia Ferguson, PA-C   1 year ago Chronic otitis media, unspecified otitis media type   [facility] [name], MD

## 2022-07-09 ENCOUNTER — Other Ambulatory Visit: Payer: Self-pay

## 2022-07-09 MED ORDER — ALPRAZOLAM 1 MG PO TABS
1.0000 mg | ORAL_TABLET | Freq: Two times a day (BID) | ORAL | 0 refills | Status: DC
  Filled 2022-07-09: qty 60, 30d supply, fill #0
  Filled 2022-07-12: qty 60, 20d supply, fill #0

## 2022-07-12 ENCOUNTER — Other Ambulatory Visit: Payer: Self-pay

## 2022-07-16 ENCOUNTER — Encounter: Payer: Self-pay | Admitting: Family Medicine

## 2022-07-16 ENCOUNTER — Ambulatory Visit (INDEPENDENT_AMBULATORY_CARE_PROVIDER_SITE_OTHER): Payer: Self-pay | Admitting: Family Medicine

## 2022-07-16 VITALS — BP 133/95 | HR 100 | Ht 68.0 in | Wt 269.0 lb

## 2022-07-16 DIAGNOSIS — E782 Mixed hyperlipidemia: Secondary | ICD-10-CM

## 2022-07-16 DIAGNOSIS — E559 Vitamin D deficiency, unspecified: Secondary | ICD-10-CM

## 2022-07-16 DIAGNOSIS — G4700 Insomnia, unspecified: Secondary | ICD-10-CM

## 2022-07-16 DIAGNOSIS — I10 Essential (primary) hypertension: Secondary | ICD-10-CM

## 2022-07-16 DIAGNOSIS — F39 Unspecified mood [affective] disorder: Secondary | ICD-10-CM

## 2022-07-16 NOTE — Progress Notes (Signed)
Established patient visit   Patient: Todd Chen   DOB: May 25, 1975   46 y.o. Male  MRN: [id number] Visit Date: 07/16/2022  Today's healthcare provider: Mila Merry, MD     Subjective    HPI Follow up anxiety, hypertension, hyperlipidemia, insomnia. He started semaglutide injection per Dr. Mayford Knife about 3 months ago and has titrated up to 1mg  /week. He started lovastatin around the same time. He is losing weight, does not drink alcohol at all anymore, and has cut back on cigarettes. Continue buspirone for anxiety which has been helpful. Only takes alprazolam at night to help him wind down and get to sleep. Was also noted to have very low vitamin D Lab Results  Component Value Date   VD25OH 15.9 04/22/2022   Has since starting some type of vitamin D injection program by Dr. Mayford Knife.   Wt Readings from Last 3 Encounters:  07/16/22 269 lb (122 kg)  04/19/22 (!) 302 lb (137 kg)  12/08/21 300 lb (136.1 kg)    Medications: Outpatient Medications Prior to Visit  Medication Sig   albuterol (VENTOLIN HFA) 108 (90 Base) MCG/ACT inhaler INHALE 2 PUFFS BY MOUTH EVERY 6 HOURS AS NEEDED   ALPRAZolam (XANAX) 1 MG tablet Take 1 tablet (1 mg total) by mouth 2 (two) to 3 (three) times daily as needed (Patient taking differently: Take 1 mg by mouth 2 (two) times daily. In the evenings)   busPIRone (BUSPAR) 7.5 MG tablet Take 1-2 tablets (7.5-15 mg total) by mouth 2 (two) times daily.   lovastatin (MEVACOR) 20 MG tablet Take 1 tablet (20 mg total) by mouth at bedtime.   Melatonin 10 MG TABS Take 20 mg by mouth at bedtime.   metoprolol succinate (TOPROL-XL) 50 MG 24 hr tablet Take 1.5 tablets (75 mg total) by mouth daily.   Mometasone Furoate (ASMANEX HFA) 100 MCG/ACT AERO Inhale 2 puffs into the lungs 2 (two) times daily.   omeprazole (PRILOSEC) 40 MG capsule One capsule each 30 minutes prior to main meal of day   SEMAGLUTIDE, 1 MG/DOSE, [place] Inject 1 mg into the skin once a week.    sucralfate (CARAFATE) 1 g tablet TAKE ONE TABLET BY MOUTH FOUR TIMES A DAY WITH MEALS AND AT BEDTIME AS NEEDED   triamcinolone (NASACORT) 55 MCG/ACT AERO nasal inhaler USE 2 SPRAYS IN EACH NOSTRIL EVERY DAY   valACYclovir (VALTREX) 1000 MG tablet Take 2 tablets (2,000 mg total) by mouth 2 (two) times daily.   No facility-administered medications prior to visit.   Review of Systems  Constitutional:  Positive for weight loss. Negative for malaise/fatigue.  Eyes:  Negative for blurred vision.  Respiratory: Negative.  Negative for cough, shortness of breath and wheezing.   Cardiovascular:  Negative for chest pain, palpitations, orthopnea, claudication, leg swelling and PND.  Neurological:  Negative for weakness and headaches.        Objective    BP (!) 133/95 (BP Location: Right Arm, Patient Position: Sitting, Cuff Size: Large)   Pulse 100   Ht 5\' 8"  (1.727 m)   Wt 269 lb (122 kg)   SpO2 97%   BMI 40.90 kg/m      Assessment & Plan     1. Hyperlipidemia, mixed He is tolerating lovastatin well with no adverse effects.   - CBC - Comprehensive metabolic panel - Lipid panel - Direct LDL  2. Morbid obesity (HCC) Doing well with 1mg  semaglutide prescribed by Dr. Mayford Knife. 30 pound weight lass noted  since beginning of year.   3. Essential hypertension Doing better with weight loss.   4. Insomnia, unspecified type Only takes alprazolam to help sleep and is working on weaning dose.   5. Vitamin D deficiency  - VITAMIN D 25 Hydroxy (Vit-D Deficiency, Fractures)       Mila Merry, MD  [facility] [phone] (phone) [phone] (fax)  [facility]

## 2022-07-19 ENCOUNTER — Ambulatory Visit: Payer: Self-pay | Admitting: Family Medicine

## 2022-08-03 ENCOUNTER — Other Ambulatory Visit: Payer: Self-pay | Admitting: Family Medicine

## 2022-08-03 DIAGNOSIS — F411 Generalized anxiety disorder: Secondary | ICD-10-CM

## 2022-08-03 NOTE — Telephone Encounter (Signed)
Requested medication (s) are due for refill today: yes  Requested medication (s) are on the active medication list: yes  Last refill:  07/09/22 #60   Future visit scheduled: yes  Notes to clinic:  med not delegated to NT to RF   Requested Prescriptions  Pending Prescriptions Disp Refills   ALPRAZolam (XANAX) 1 MG tablet 60 tablet 0    Sig: Take 1 tablet (1 mg total) by mouth 2 (two) to 3 (three) times daily as needed     Not Delegated - Psychiatry: Anxiolytics/Hypnotics 2 Failed - 08/03/2022  2:28 PM      Failed - This refill cannot be delegated      Failed - Urine Drug Screen completed in last 360 days      Passed - Patient is not pregnant      Passed - Valid encounter within last 6 months    Recent Outpatient Visits           2 weeks ago Hyperlipidemia, mixed   [place] [facility] [name], MD   3 months ago Anxiety, generalized   [facility] [name], MD   7 months ago Morbid obesity [facility])   [place] [facility] [name], MD   10 months ago Hyperglycemia   [facility] [name], MD   11 months ago Episodic mood disorder [facility])    [facility] Alfredia Ferguson, PA-C       Future Appointments             In 2 months [name], [name], MD [facility], [facility]

## 2022-08-04 ENCOUNTER — Other Ambulatory Visit: Payer: Self-pay

## 2022-08-05 ENCOUNTER — Other Ambulatory Visit: Payer: Self-pay

## 2022-08-05 ENCOUNTER — Other Ambulatory Visit: Payer: Self-pay | Admitting: Family Medicine

## 2022-08-05 DIAGNOSIS — F411 Generalized anxiety disorder: Secondary | ICD-10-CM

## 2022-08-06 ENCOUNTER — Other Ambulatory Visit: Payer: Self-pay

## 2022-08-06 MED FILL — Alprazolam Tab 1 MG: ORAL | 30 days supply | Qty: 60 | Fill #0 | Status: AC

## 2022-08-25 ENCOUNTER — Other Ambulatory Visit: Payer: Self-pay

## 2022-08-25 MED FILL — Alprazolam Tab 1 MG: ORAL | 30 days supply | Qty: 60 | Fill #1 | Status: CN

## 2022-08-25 MED FILL — Valacyclovir HCl Tab 1 GM: ORAL | 30 days supply | Qty: 120 | Fill #1 | Status: AC

## 2022-08-26 ENCOUNTER — Encounter: Payer: Self-pay | Admitting: Family Medicine

## 2022-08-26 ENCOUNTER — Other Ambulatory Visit: Payer: Self-pay

## 2022-08-26 ENCOUNTER — Telehealth: Payer: Self-pay | Admitting: Family Medicine

## 2022-08-26 DIAGNOSIS — F411 Generalized anxiety disorder: Secondary | ICD-10-CM

## 2022-08-26 MED FILL — Alprazolam Tab 1 MG: ORAL | 30 days supply | Qty: 60 | Fill #1 | Status: CN

## 2022-08-26 NOTE — Telephone Encounter (Signed)
Patient request dose change in Xanax and would like to discontinued buspirone 7.5 mg. Please advise patient.

## 2022-08-26 NOTE — Telephone Encounter (Signed)
No , we are not changing to three times a day

## 2022-08-26 NOTE — Telephone Encounter (Signed)
Pt stated he wants provider to change his ALPRAZolam (XANAX) 1 MG tablet from 2 tablets a day to 3 tablets a day. He is going out of town and has no more meds to take with his on his vacation tomorrow. Pt says if he doesn't get his meds he will not be able to go out of town. He wants to stop taking busPIRone (BUSPAR) 7.5 MG tablet as if makes him have an out of body experience. Please f/u with pt to see if his script can be written for 90 tablets per month.

## 2022-08-27 ENCOUNTER — Other Ambulatory Visit: Payer: Self-pay

## 2022-08-27 DIAGNOSIS — F411 Generalized anxiety disorder: Secondary | ICD-10-CM

## 2022-08-27 MED ORDER — ALPRAZOLAM 1 MG PO TABS
1.0000 mg | ORAL_TABLET | Freq: Three times a day (TID) | ORAL | 1 refills | Status: DC | PRN
Start: 2022-08-27 — End: 2022-10-26
  Filled 2022-08-27 – 2022-09-06 (×2): qty 90, 30d supply, fill #0
  Filled 2022-10-05: qty 90, 30d supply, fill #1

## 2022-08-27 NOTE — Telephone Encounter (Signed)
This came as a Clinical cytogeneticist message I need a refill. My last refill was not the same amount I was previously getting and I am almost out and leaving town tomorrow. I do not take the buspar because it doesn't help.   LOV 06/26/22 NOV 10/15/22 LRF 08/06/22 60 x 2

## 2022-08-27 NOTE — Telephone Encounter (Signed)
Pt is calling back to check on his medication. I did advised that a message was sent to his PCP at 11:54 am regarding his medication. Pt states that his wife is getting on him due to him almost being out of medication and they are going out of town.   Pt states that he is starting to get anxiety due to him almost being out of medication and is wanting to make sure he has his medication before he go out of town.    Pt is also wanting to know if he takes 1/2 a pill if he will get sick.   Please advise.

## 2022-09-06 ENCOUNTER — Other Ambulatory Visit: Payer: Self-pay

## 2022-10-05 MED FILL — Valacyclovir HCl Tab 1 GM: ORAL | 30 days supply | Qty: 120 | Fill #2 | Status: AC

## 2022-10-06 ENCOUNTER — Other Ambulatory Visit: Payer: Self-pay

## 2022-10-15 ENCOUNTER — Encounter: Payer: Self-pay | Admitting: Family Medicine

## 2022-10-15 ENCOUNTER — Ambulatory Visit (INDEPENDENT_AMBULATORY_CARE_PROVIDER_SITE_OTHER): Payer: Self-pay | Admitting: Family Medicine

## 2022-10-15 VITALS — BP 135/89 | HR 87 | Temp 97.9°F | Ht 67.99 in | Wt 245.3 lb

## 2022-10-15 DIAGNOSIS — E559 Vitamin D deficiency, unspecified: Secondary | ICD-10-CM

## 2022-10-15 DIAGNOSIS — F411 Generalized anxiety disorder: Secondary | ICD-10-CM

## 2022-10-15 DIAGNOSIS — E782 Mixed hyperlipidemia: Secondary | ICD-10-CM

## 2022-10-18 NOTE — Progress Notes (Signed)
      Established patient visit   Patient: Todd Chen   DOB: 12-09-1975   47 y.o. Male  MRN: [id number] Visit Date: 10/15/2022  Today's healthcare provider: Mila Merry, MD   Chief Complaint  Patient presents with   Hypertension   Hyperlipidemia   Mood   Subjective    HPI  Here to follow up on anxiety. Is working on weaning alprazolam which has been very difficulty. Has started back on buspirone but only tolerating one every day. Has reduced alprazolam to 1 tablet daily.   Is also overdue for follow up cholesterol, labs ordered in April have not yet been drawn. Is noted be on semaglutide prescribed by another healthcare provider.   Medications: Outpatient Medications Prior to Visit  Medication Sig   albuterol (VENTOLIN HFA) 108 (90 Base) MCG/ACT inhaler INHALE 2 PUFFS BY MOUTH EVERY 6 HOURS AS NEEDED   ALPRAZolam (XANAX) 1 MG tablet Take 1 tablet (1 mg total) by mouth 3 (three) times daily as needed for anxiety.   busPIRone (BUSPAR) 7.5 MG tablet Take 1-2 tablets (7.5-15 mg total) by mouth 2 (two) times daily.   lovastatin (MEVACOR) 20 MG tablet Take 1 tablet (20 mg total) by mouth at bedtime.   Melatonin 10 MG TABS Take 20 mg by mouth at bedtime.   metoprolol succinate (TOPROL-XL) 50 MG 24 hr tablet Take 1.5 tablets (75 mg total) by mouth daily.   Mometasone Furoate (ASMANEX HFA) 100 MCG/ACT AERO Inhale 2 puffs into the lungs 2 (two) times daily.   omeprazole (PRILOSEC) 40 MG capsule One capsule each 30 minutes prior to main meal of day   SEMAGLUTIDE, 1 MG/DOSE, [place] Inject 1 mg into the skin once a week.   sucralfate (CARAFATE) 1 g tablet TAKE ONE TABLET BY MOUTH FOUR TIMES A DAY WITH MEALS AND AT BEDTIME AS NEEDED   triamcinolone (NASACORT) 55 MCG/ACT AERO nasal inhaler USE 2 SPRAYS IN EACH NOSTRIL EVERY DAY   valACYclovir (VALTREX) 1000 MG tablet Take 2 tablets (2,000 mg total) by mouth 2 (two) times daily.   No facility-administered medications prior to visit.        Objective    BP 135/89   Pulse 87   Temp 97.9 F (36.6 C) (Oral)   Ht 5' 7.99" (1.727 m)   Wt 245 lb 4.8 oz (111.3 kg)   BMI 37.31 kg/m    Physical Exam   General: Appearance:    Mildly obese male in no acute distress  Eyes:    PERRL, conjunctiva/corneas clear, EOM's intact       Lungs:     Clear to auscultation bilaterally, respirations unlabored  Heart:    Normal heart rate. Normal rhythm. No murmurs, rubs, or gallops.    MS:   All extremities are intact.    Neurologic:   Awake, alert, oriented x 3. No apparent focal neurological defect.         Assessment & Plan     1. Anxiety, generalized Continue prn alprazolam, continue buspirone,  currently only tolerating one tablet daily  2. Hyperlipidemia, mixed  - CBC - Comprehensive metabolic panel - Lipid panel  3. Vitamin D deficiency  - VITAMIN D 25 Hydroxy (Vit-D Deficiency, Fractures)        Mila Merry, MD  [facility] [phone] (phone) [phone] (fax)  [facility]

## 2022-10-25 ENCOUNTER — Other Ambulatory Visit: Payer: Self-pay

## 2022-10-25 ENCOUNTER — Other Ambulatory Visit: Payer: Self-pay | Admitting: Family Medicine

## 2022-10-25 DIAGNOSIS — F411 Generalized anxiety disorder: Secondary | ICD-10-CM

## 2022-10-26 ENCOUNTER — Other Ambulatory Visit: Payer: Self-pay

## 2022-10-26 MED FILL — Alprazolam Tab 1 MG: ORAL | 30 days supply | Qty: 90 | Fill #0 | Status: CN

## 2022-10-29 ENCOUNTER — Other Ambulatory Visit: Payer: Self-pay | Admitting: Family Medicine

## 2022-10-29 DIAGNOSIS — F411 Generalized anxiety disorder: Secondary | ICD-10-CM

## 2022-10-29 MED FILL — Alprazolam Tab 1 MG: ORAL | 30 days supply | Qty: 90 | Fill #0 | Status: CN

## 2022-10-31 ENCOUNTER — Other Ambulatory Visit: Payer: Self-pay

## 2022-10-31 ENCOUNTER — Other Ambulatory Visit: Payer: Self-pay | Admitting: Family Medicine

## 2022-10-31 DIAGNOSIS — F411 Generalized anxiety disorder: Secondary | ICD-10-CM

## 2022-11-01 ENCOUNTER — Other Ambulatory Visit: Payer: Self-pay

## 2022-11-02 ENCOUNTER — Other Ambulatory Visit: Payer: Self-pay

## 2022-11-02 MED FILL — Buspirone HCl Tab 7.5 MG: ORAL | 30 days supply | Qty: 120 | Fill #0 | Status: AC

## 2022-11-02 NOTE — Telephone Encounter (Signed)
Requested Prescriptions  Pending Prescriptions Disp Refills   busPIRone (BUSPAR) 7.5 MG tablet 120 tablet 2    Sig: Take 1-2 tablets (7.5-15 mg total) by mouth 2 (two) times daily.     Psychiatry: Anxiolytics/Hypnotics - Non-controlled Passed - 10/31/2022  9:41 PM      Passed - Valid encounter within last 12 months    Recent Outpatient Visits           2 weeks ago Anxiety, generalized   [place] [facility] [name], MD   3 months ago Hyperlipidemia, mixed   [place] [facility] [name], MD   6 months ago Anxiety, generalized   [facility] [name], MD   10 months ago Morbid obesity [facility])   [place] [facility] [name], MD   1 year ago Hyperglycemia   [facility] [name], MD

## 2022-11-08 ENCOUNTER — Other Ambulatory Visit: Payer: Self-pay

## 2022-11-08 MED FILL — Alprazolam Tab 1 MG: ORAL | 30 days supply | Qty: 90 | Fill #0 | Status: AC

## 2022-12-06 ENCOUNTER — Other Ambulatory Visit: Payer: Self-pay

## 2022-12-06 MED FILL — Buspirone HCl Tab 7.5 MG: ORAL | 30 days supply | Qty: 120 | Fill #1 | Status: AC

## 2022-12-06 MED FILL — Alprazolam Tab 1 MG: ORAL | 30 days supply | Qty: 90 | Fill #1 | Status: AC

## 2022-12-28 ENCOUNTER — Other Ambulatory Visit: Payer: Self-pay | Admitting: Family Medicine

## 2022-12-28 DIAGNOSIS — F411 Generalized anxiety disorder: Secondary | ICD-10-CM

## 2022-12-29 ENCOUNTER — Other Ambulatory Visit: Payer: Self-pay

## 2022-12-29 MED ORDER — VALACYCLOVIR HCL 1 G PO TABS
2000.0000 mg | ORAL_TABLET | Freq: Two times a day (BID) | ORAL | 0 refills | Status: DC
  Filled 2022-12-29: qty 120, 30d supply, fill #0
  Filled 2023-01-25: qty 120, 30d supply, fill #1
  Filled 2023-02-21: qty 120, 30d supply, fill #2

## 2022-12-29 MED ORDER — ALBUTEROL SULFATE HFA 108 (90 BASE) MCG/ACT IN AERS
2.0000 | INHALATION_SPRAY | Freq: Four times a day (QID) | RESPIRATORY_TRACT | 0 refills | Status: DC | PRN
  Filled 2022-12-29: qty 6.7, 25d supply, fill #0

## 2022-12-29 NOTE — Telephone Encounter (Signed)
Requested medication (s) are due for refill today: yes  Requested medication (s) are on the active medication list: yes  Last refill:  10/26/22  Future visit scheduled: no  Notes to clinic:  Unable to refill per protocol, cannot delegate.      Requested Prescriptions  Pending Prescriptions Disp Refills   ALPRAZolam (XANAX) 1 MG tablet 90 tablet 1    Sig: Take 1 tablet (1 mg total) by mouth 3 (three) times daily as needed for anxiety.     Not Delegated - Psychiatry: Anxiolytics/Hypnotics 2 Failed - 12/28/2022  7:33 PM      Failed - This refill cannot be delegated      Failed - Urine Drug Screen completed in last 360 days      Passed - Patient is not pregnant      Passed - Valid encounter within last 6 months    Recent Outpatient Visits           2 months ago Anxiety, generalized   [place] [facility] [name], MD   5 months ago Hyperlipidemia, mixed   [place] [facility] [name], MD   8 months ago Anxiety, generalized   [place] [facility] [name], MD   1 year ago Morbid obesity [facility])   [place] [facility] [name], MD   1 year ago Hyperglycemia   [place] [facility] [name], MD              Signed Prescriptions Disp Refills   albuterol (VENTOLIN HFA) 108 (90 Base) MCG/ACT inhaler 6.7 g 0    Sig: INHALE 2 PUFFS BY MOUTH EVERY 6 HOURS AS NEEDED     Pulmonology:  Beta Agonists 2 Passed - 12/28/2022  7:33 PM      Passed - Last BP in normal range    BP Readings from Last 1 Encounters:  10/15/22 135/89         Passed - Last Heart Rate in normal range    Pulse Readings from Last 1 Encounters:  10/15/22 87         Passed - Valid encounter within last 12 months    Recent Outpatient Visits           2 months ago Anxiety, generalized   [place] [name], [facility]   5 months ago Hyperlipidemia,  mixed   [place] [facility] [name], MD   8 months ago Anxiety, generalized   [place] [facility] [name], MD   1 year ago Morbid obesity [facility])   [place] [facility] [name], MD   1 year ago Hyperglycemia    [facility] [name], MD               valACYclovir (VALTREX) 1000 MG tablet 360 tablet 0    Sig: Take 2 tablets (2,000 mg total) by mouth 2 (two) times daily.     Antimicrobials:  Antiviral Agents - Anti-Herpetic Passed - 12/28/2022  7:33 PM      Passed - Valid encounter within last 12 months    Recent Outpatient Visits           2 months ago Anxiety, generalized   [facility] [name], MD   5 months ago Hyperlipidemia, mixed    [place] [facility]  [name], MD   8 months ago Anxiety, generalized   [place] [name], MD   1 year ago Morbid obesity [facility])   [place] [facility] [name], MD   1 year ago Hyperglycemia   [place] [facility] [name], MD

## 2022-12-29 NOTE — Telephone Encounter (Signed)
Requested Prescriptions  Pending Prescriptions Disp Refills   albuterol (VENTOLIN HFA) 108 (90 Base) MCG/ACT inhaler 6.7 g 0    Sig: INHALE 2 PUFFS BY MOUTH EVERY 6 HOURS AS NEEDED     Pulmonology:  Beta Agonists 2 Passed - 12/28/2022  7:33 PM      Passed - Last BP in normal range    BP Readings from Last 1 Encounters:  10/15/22 135/89         Passed - Last Heart Rate in normal range    Pulse Readings from Last 1 Encounters:  10/15/22 87         Passed - Valid encounter within last 12 months    Recent Outpatient Visits           2 months ago Anxiety, generalized   [place] [facility] [name], MD   5 months ago Hyperlipidemia, mixed   [place] [facility] [name], MD   8 months ago Anxiety, generalized   [place] [facility] [name], MD   1 year ago Morbid obesity [facility])   [place] [facility] [name], MD   1 year ago Hyperglycemia   [place] [facility] [name], MD               valACYclovir (VALTREX) 1000 MG tablet 360 tablet 0    Sig: Take 2 tablets (2,000 mg total) by mouth 2 (two) times daily.     Antimicrobials:  Antiviral Agents - Anti-Herpetic Passed - 12/28/2022  7:33 PM      Passed - Valid encounter within last 12 months    Recent Outpatient Visits           2 months ago Anxiety, generalized   [place] [facility] [name], MD   5 months ago Hyperlipidemia, mixed   [place] [facility] [name], MD   8 months ago Anxiety, generalized   [place] [facility] [name], MD   1 year ago Morbid obesity [facility])   [place] [facility] [name], MD   1 year ago Hyperglycemia   [place] [facility] [name], MD               ALPRAZolam Prudy Feeler) 1 MG tablet 90 tablet 1    Sig: Take 1 tablet (1 mg  total) by mouth 3 (three) times daily as needed for anxiety.     Not Delegated - Psychiatry: Anxiolytics/Hypnotics 2 Failed - 12/28/2022  7:33 PM      Failed - This refill cannot be delegated      Failed - Urine Drug Screen completed in last 360 days      Passed - Patient is not pregnant      Passed - Valid encounter within last 6 months    Recent Outpatient Visits           2 months ago Anxiety, generalized   [place] [facility] [name], MD   5 months ago Hyperlipidemia, mixed   [place] [facility] [name], MD   8 months ago Anxiety, generalized   [place] [facility] [name], MD   1 year ago Morbid obesity [facility])   [place] [facility] [name], MD   1 year ago Hyperglycemia   [facility] [name], [name], MD

## 2022-12-30 ENCOUNTER — Other Ambulatory Visit: Payer: Self-pay

## 2022-12-30 MED ORDER — ALPRAZOLAM 1 MG PO TABS
1.0000 mg | ORAL_TABLET | Freq: Three times a day (TID) | ORAL | 1 refills | Status: DC | PRN
  Filled 2022-12-30 – 2023-01-02 (×2): qty 90, 30d supply, fill #0
  Filled 2023-01-25 – 2023-01-31 (×2): qty 90, 30d supply, fill #1

## 2023-01-02 ENCOUNTER — Other Ambulatory Visit: Payer: Self-pay

## 2023-01-03 ENCOUNTER — Other Ambulatory Visit: Payer: Self-pay | Admitting: Family Medicine

## 2023-01-03 ENCOUNTER — Other Ambulatory Visit: Payer: Self-pay

## 2023-01-04 ENCOUNTER — Other Ambulatory Visit: Payer: Self-pay

## 2023-01-04 MED FILL — Omeprazole Cap Delayed Release 40 MG: ORAL | 90 days supply | Qty: 90 | Fill #0 | Status: CN

## 2023-01-04 NOTE — Telephone Encounter (Signed)
Requested Prescriptions  Pending Prescriptions Disp Refills   omeprazole (PRILOSEC) 40 MG capsule 90 capsule 2    Sig: One capsule each 30 minutes prior to main meal of day     Gastroenterology: Proton Pump Inhibitors Passed - 01/03/2023  7:44 AM      Passed - Valid encounter within last 12 months    Recent Outpatient Visits           2 months ago Anxiety, generalized   [place] [facility] [name], MD   5 months ago Hyperlipidemia, mixed   [name], MD   8 months ago Anxiety, generalized   [place] [name], MD   1 year ago Morbid obesity [facility])   [place] [facility] [name], MD   1 year ago Hyperglycemia   [facility] [name], MD

## 2023-01-05 ENCOUNTER — Other Ambulatory Visit: Payer: Self-pay

## 2023-01-17 ENCOUNTER — Other Ambulatory Visit: Payer: Self-pay

## 2023-01-25 ENCOUNTER — Other Ambulatory Visit: Payer: Self-pay

## 2023-01-25 MED FILL — Buspirone HCl Tab 7.5 MG: ORAL | 30 days supply | Qty: 120 | Fill #2 | Status: AC

## 2023-01-31 ENCOUNTER — Other Ambulatory Visit: Payer: Self-pay

## 2023-02-21 ENCOUNTER — Other Ambulatory Visit: Payer: Self-pay | Admitting: Family Medicine

## 2023-02-21 ENCOUNTER — Other Ambulatory Visit: Payer: Self-pay

## 2023-02-21 DIAGNOSIS — F411 Generalized anxiety disorder: Secondary | ICD-10-CM

## 2023-02-21 MED FILL — Alprazolam Tab 1 MG: ORAL | 30 days supply | Qty: 90 | Fill #0 | Status: CN

## 2023-02-21 NOTE — Telephone Encounter (Signed)
Please advise 

## 2023-02-22 ENCOUNTER — Other Ambulatory Visit: Payer: Self-pay | Admitting: Family Medicine

## 2023-02-22 ENCOUNTER — Other Ambulatory Visit: Payer: Self-pay

## 2023-02-22 MED FILL — Alprazolam Tab 1 MG: ORAL | 30 days supply | Qty: 90 | Fill #0 | Status: CN

## 2023-02-23 ENCOUNTER — Other Ambulatory Visit: Payer: Self-pay

## 2023-02-23 MED ORDER — ALBUTEROL SULFATE HFA 108 (90 BASE) MCG/ACT IN AERS
2.0000 | INHALATION_SPRAY | Freq: Four times a day (QID) | RESPIRATORY_TRACT | 1 refills | Status: DC | PRN
  Filled 2023-02-23: qty 6.7, 25d supply, fill #0

## 2023-02-28 ENCOUNTER — Other Ambulatory Visit: Payer: Self-pay

## 2023-02-28 MED FILL — Alprazolam Tab 1 MG: ORAL | 30 days supply | Qty: 90 | Fill #0 | Status: AC

## 2023-03-28 ENCOUNTER — Other Ambulatory Visit: Payer: Self-pay

## 2023-03-28 ENCOUNTER — Other Ambulatory Visit: Payer: Self-pay | Admitting: Family Medicine

## 2023-03-28 DIAGNOSIS — I10 Essential (primary) hypertension: Secondary | ICD-10-CM

## 2023-03-28 DIAGNOSIS — F411 Generalized anxiety disorder: Secondary | ICD-10-CM

## 2023-03-28 MED FILL — Alprazolam Tab 1 MG: ORAL | 30 days supply | Qty: 90 | Fill #1 | Status: AC

## 2023-03-29 ENCOUNTER — Other Ambulatory Visit: Payer: Self-pay

## 2023-03-29 ENCOUNTER — Other Ambulatory Visit: Payer: Self-pay | Admitting: Family Medicine

## 2023-03-29 DIAGNOSIS — I10 Essential (primary) hypertension: Secondary | ICD-10-CM

## 2023-03-29 DIAGNOSIS — F411 Generalized anxiety disorder: Secondary | ICD-10-CM

## 2023-04-16 ENCOUNTER — Other Ambulatory Visit: Payer: Self-pay | Admitting: Family Medicine

## 2023-04-16 DIAGNOSIS — I10 Essential (primary) hypertension: Secondary | ICD-10-CM

## 2023-04-16 DIAGNOSIS — F411 Generalized anxiety disorder: Secondary | ICD-10-CM

## 2023-04-16 DIAGNOSIS — E782 Mixed hyperlipidemia: Secondary | ICD-10-CM

## 2023-04-17 ENCOUNTER — Other Ambulatory Visit: Payer: Self-pay

## 2023-04-17 ENCOUNTER — Other Ambulatory Visit: Payer: Self-pay | Admitting: Family Medicine

## 2023-04-17 DIAGNOSIS — E782 Mixed hyperlipidemia: Secondary | ICD-10-CM

## 2023-04-17 DIAGNOSIS — F411 Generalized anxiety disorder: Secondary | ICD-10-CM

## 2023-04-17 DIAGNOSIS — I10 Essential (primary) hypertension: Secondary | ICD-10-CM

## 2023-04-19 ENCOUNTER — Other Ambulatory Visit: Payer: Self-pay

## 2023-04-19 MED ORDER — VALACYCLOVIR HCL 1 G PO TABS
2000.0000 mg | ORAL_TABLET | Freq: Two times a day (BID) | ORAL | 1 refills | Status: DC
  Filled 2023-04-19: qty 360, 90d supply, fill #0
  Filled 2023-04-21: qty 90, 23d supply, fill #0

## 2023-04-19 NOTE — Telephone Encounter (Signed)
Requested medications are due for refill today.  Yes - a little too soon for xanax  Requested medications are on the active medications list.  yes  Last refill. Xanax 02/21/2023 #90 1 rf, The other 2 05/21/2022 9 month supply  Future visit scheduled.   no  Notes to clinic.  Xanax not delegated. Expired labs and need an appt.    Requested Prescriptions  Pending Prescriptions Disp Refills   ALPRAZolam (XANAX) 1 MG tablet 90 tablet 1    Sig: Take 1 tablet (1 mg total) by mouth 3 (three) times daily as needed for anxiety.     Not Delegated - Psychiatry: Anxiolytics/Hypnotics 2 Failed - 04/19/2023  4:59 PM      Failed - This refill cannot be delegated      Failed - Urine Drug Screen completed in last 360 days      Failed - Valid encounter within last 6 months    Recent Outpatient Visits           6 months ago Anxiety, generalized   [place] [facility] [name], MD   9 months ago Hyperlipidemia, mixed   [place] [facility] [name], MD   1 year ago Anxiety, generalized   [place] [facility] [name], MD   1 year ago Morbid obesity [facility])   [place] [facility] [name], MD   1 year ago Hyperglycemia   [place] [facility] [name], MD              Passed - Patient is not pregnant       lovastatin (MEVACOR) 20 MG tablet 90 tablet 2    Sig: Take 1 tablet (20 mg total) by mouth at bedtime.     Cardiovascular:  Antilipid - Statins 2 Failed - 04/19/2023  4:59 PM      Failed - Cr in normal range and within 360 days    Creatinine  Date Value Ref Range Status  08/27/2021 1.0 0.6 - 1.3 Final   Creatinine, Ser  Date Value Ref Range Status  07/14/2020 0.98 0.76 - 1.27 mg/dL Final         Failed - Lipid Panel in normal range within the last 12 months    Cholesterol, Total  Date Value Ref Range Status  07/14/2020 197 100 - 199 mg/dL Final    Cholesterol  Date Value Ref Range Status  08/27/2021 192 0 - 200 Final   LDL Chol Calc (NIH)  Date Value Ref Range Status  07/14/2020 90 0 - 99 mg/dL Final   LDL Cholesterol  Date Value Ref Range Status  08/27/2021 119  Final   HDL  Date Value Ref Range Status  08/27/2021 27 (A) 35 - 70 Final  07/14/2020 33 (L) >39 mg/dL Final   Triglycerides  Date Value Ref Range Status  08/27/2021 23 (A) 40 - 160 Final         Passed - Patient is not pregnant      Passed - Valid encounter within last 12 months    Recent Outpatient Visits           6 months ago Anxiety, generalized   [place] [facility] [name], MD   9 months ago Hyperlipidemia, mixed   [name]   1 year ago Anxiety, generalized   [place] [facility]  [name], MD   1 year ago Morbid obesity [facility])   [place] [facility] [name], MD   1 year ago Hyperglycemia   [place] [facility] [name], MD               metoprolol succinate (TOPROL-XL) 50 MG 24 hr tablet 135 tablet 2    Sig: Take 1.5 tablets (75 mg total) by mouth daily.     Cardiovascular:  Beta Blockers Failed - 04/19/2023  4:59 PM      Failed - Valid encounter within last 6 months    Recent Outpatient Visits           6 months ago Anxiety, generalized   [place] [facility] [name], MD   9 months ago Hyperlipidemia, mixed   [place] [facility] [name], MD   1 year ago Anxiety, generalized   [place] [facility] [name], MD   1 year ago Morbid obesity [facility])   [place] [facility] [name], MD   1 year ago Hyperglycemia   [facility] [name], MD              Passed - Last BP in normal range    BP Readings from Last 1 Encounters:  10/15/22  135/89         Passed - Last Heart Rate in normal range    Pulse Readings from Last 1 Encounters:  10/15/22 87

## 2023-04-20 ENCOUNTER — Other Ambulatory Visit: Payer: Self-pay

## 2023-04-20 MED FILL — Lovastatin Tab 20 MG: ORAL | 90 days supply | Qty: 90 | Fill #0 | Status: CN

## 2023-04-20 MED FILL — Metoprolol Succinate Tab ER 24HR 50 MG (Tartrate Equiv): ORAL | 90 days supply | Qty: 135 | Fill #0 | Status: AC

## 2023-04-21 ENCOUNTER — Encounter: Payer: Self-pay | Admitting: Family Medicine

## 2023-04-21 ENCOUNTER — Other Ambulatory Visit: Payer: Self-pay

## 2023-04-21 MED FILL — Lovastatin Tab 20 MG: ORAL | 90 days supply | Qty: 90 | Fill #0 | Status: CN

## 2023-04-22 ENCOUNTER — Other Ambulatory Visit: Payer: Self-pay

## 2023-04-22 DIAGNOSIS — F411 Generalized anxiety disorder: Secondary | ICD-10-CM

## 2023-04-22 DIAGNOSIS — I10 Essential (primary) hypertension: Secondary | ICD-10-CM

## 2023-04-22 MED ORDER — BUSPIRONE HCL 7.5 MG PO TABS: 7.5000 mg | ORAL_TABLET | Freq: Two times a day (BID) | ORAL | 1 refills | Status: DC

## 2023-04-22 MED ORDER — ALPRAZOLAM 1 MG PO TABS: 1.0000 mg | ORAL_TABLET | Freq: Three times a day (TID) | ORAL | 0 refills | Status: DC | PRN

## 2023-04-22 MED ORDER — ALBUTEROL SULFATE HFA 108 (90 BASE) MCG/ACT IN AERS: 2.0000 | INHALATION_SPRAY | Freq: Four times a day (QID) | RESPIRATORY_TRACT | 1 refills | Status: AC | PRN

## 2023-04-22 MED ORDER — METOPROLOL SUCCINATE ER 50 MG PO TB24: 75.0000 mg | ORAL_TABLET | Freq: Every day | ORAL | 2 refills | Status: AC

## 2023-04-22 MED ORDER — VALACYCLOVIR HCL 1 G PO TABS: 2000.0000 mg | ORAL_TABLET | Freq: Two times a day (BID) | ORAL | 0 refills | Status: AC

## 2023-04-22 NOTE — Telephone Encounter (Signed)
Patient is requesting that all of his prescriptions get sent to a different pharmacy due to his wife no longer being employed by [employer]. He wants them to go to [facility] [id number]

## 2023-04-26 ENCOUNTER — Other Ambulatory Visit: Payer: Self-pay | Admitting: Family Medicine

## 2023-04-26 DIAGNOSIS — F411 Generalized anxiety disorder: Secondary | ICD-10-CM

## 2023-05-13 ENCOUNTER — Other Ambulatory Visit: Payer: Self-pay | Admitting: Family Medicine

## 2023-05-13 ENCOUNTER — Encounter: Payer: Self-pay | Admitting: Family Medicine

## 2023-05-13 ENCOUNTER — Ambulatory Visit (INDEPENDENT_AMBULATORY_CARE_PROVIDER_SITE_OTHER): Payer: Self-pay | Admitting: Family Medicine

## 2023-05-13 VITALS — BP 132/79 | HR 59 | Resp 16 | Ht 67.0 in | Wt 237.8 lb

## 2023-05-13 DIAGNOSIS — E559 Vitamin D deficiency, unspecified: Secondary | ICD-10-CM

## 2023-05-13 DIAGNOSIS — Z0000 Encounter for general adult medical examination without abnormal findings: Secondary | ICD-10-CM

## 2023-05-13 DIAGNOSIS — Z720 Tobacco use: Secondary | ICD-10-CM

## 2023-05-13 DIAGNOSIS — Z0001 Encounter for general adult medical examination with abnormal findings: Secondary | ICD-10-CM

## 2023-05-13 DIAGNOSIS — R739 Hyperglycemia, unspecified: Secondary | ICD-10-CM

## 2023-05-13 DIAGNOSIS — Z1159 Encounter for screening for other viral diseases: Secondary | ICD-10-CM

## 2023-05-13 DIAGNOSIS — I10 Essential (primary) hypertension: Secondary | ICD-10-CM

## 2023-05-13 DIAGNOSIS — Z6837 Body mass index (BMI) 37.0-37.9, adult: Secondary | ICD-10-CM

## 2023-05-13 DIAGNOSIS — F411 Generalized anxiety disorder: Secondary | ICD-10-CM

## 2023-05-13 DIAGNOSIS — E782 Mixed hyperlipidemia: Secondary | ICD-10-CM

## 2023-05-13 DIAGNOSIS — Z1211 Encounter for screening for malignant neoplasm of colon: Secondary | ICD-10-CM

## 2023-05-13 NOTE — Progress Notes (Signed)
 Complete physical exam   Patient: Todd Chen   DOB: Mar 25, 1975   47 y.o. Male  MRN: [id number] Visit Date: 05/13/2023  Today's healthcare provider: Mila Merry, MD   Chief Complaint  Patient presents with   Annual Exam   Subjective    Discussed the use of AI scribe software for clinical note transcription with the patient, who gave verbal consent to proceed.  History of Present Illness   Todd Chen is a 48 year old male who presents for an annual physical exam.  He has a history of obesity, hyperlipidemia, hypertension and has been managing his weight and lifestyle changes over the past few months. He stopped taking semaglutide in November after significant weight loss of approximately 80 pounds. Since discontinuing the medication, he has experienced increased hunger but has maintained his weight through regular exercise, including daily cardio and pushups. His weight has recently increased slightly from 225 pounds to 231-232 pounds from a baseline of over 300 pounds. He is not currently consuming alcohol and is focusing on maintaining a healthy lifestyle.  He discontinued Xanax for a period, which resulted in significant sleep disturbances, including sleeping only one to two hours per night. He resumed his normal dose after experiencing these issues. He also tried Buspar to reduce Xanax use but did not find it effective and returned to his previous regimen. He takes metoprolol for blood pressure management, having reduced the dose from one and a half to one pill as his weight decreased. He has stopped taking statins due to concerns about side effects, particularly dementia, as advised by his wife.  He reports a recent abscess on his leg, for which he completed a course of antibiotics. He did not allow it to be drained, and it did not come to a head.  He continues to smoke cigarettes, although less than before, and consumes Dr. Reino Kent regularly. He has been dealing with  stress related to family issues, particularly concerning his brothers, which has impacted his mental health. He finds exercise helps manage his anxiety and stress.         Past Medical History:  Diagnosis Date   Chronic back pain    Herpes labialis    PUD (peptic ulcer disease)    Scrotal cyst    Past Surgical History:  Procedure Laterality Date   CARPAL TUNNEL RELEASE Right 11/2018   Donnald Garre, MD [facility]   CHOLECYSTECTOMY  2002   Social History   Socioeconomic History   Marital status: Married    Spouse name: Not on file   Number of children: Not on file   Years of education: Not on file   Highest education level: Some college, no degree  Occupational History   Not on file  Tobacco Use   Smoking status: Every Day    Current packs/day: 2.00    Types: Cigarettes   Smokeless tobacco: Never  Substance and Sexual Activity   Alcohol use: Yes    Alcohol/week: 0.0 standard drinks of alcohol    Comment: a couple of beers each week or less   Drug use: Not on file   Sexual activity: Not on file  Other Topics Concern   Not on file  Social History Narrative   Not on file   Social Drivers of Health   Financial Resource Strain: Medium Risk (05/11/2023)   Overall Financial Resource Strain (CARDIA)    Difficulty of Paying Living Expenses: Somewhat hard  Food Insecurity: No Food  Insecurity (05/11/2023)   Hunger Vital Sign    Worried About Running Out of Food in the Last Year: Never true    Ran Out of Food in the Last Year: Never true  Transportation Needs: No Transportation Needs (05/11/2023)   PRAPARE - Administrator, Civil Service (Medical): No    Lack of Transportation (Non-Medical): No  Physical Activity: Sufficiently Active (05/11/2023)   Exercise Vital Sign    Days of Exercise per Week: 7 days    Minutes of Exercise per Session: 30 min  Stress: Stress Concern Present (05/11/2023)   [employer] of Occupational Health - Occupational Stress  Questionnaire    Feeling of Stress : To some extent  Social Connections: Moderately Integrated (05/11/2023)   Social Connection and Isolation Panel [NHANES]    Frequency of Communication with Friends and Family: Three times a week    Frequency of Social Gatherings with Friends and Family: Patient declined    Attends Religious Services: 1 to 4 times per year    Active Member of [employer] or Organizations: No    Attends Engineer, structural: Not on file    Marital Status: Married  Catering manager Violence: Not on file   Family Status  Relation Name Status   Mother  Alive   Father  Alive   Brother  (Not Specified)   Brother  (Not Specified)   Sister  (Not Specified)  No partnership data on file   Family History  Problem Relation Age of Onset   Emphysema Mother    Hypertension Father    Diabetes Father    Hyperlipidemia Father    Allergies  Allergen Reactions   Chantix  [Varenicline]     Crazy Dreams   Wellbutrin Sr  [Bupropion Hcl]     Stomach pains    Patient Care Team: [name], MD as PCP - General (Family Medicine)   Medications: Outpatient Medications Prior to Visit  Medication Sig   albuterol (VENTOLIN HFA) 108 (90 Base) MCG/ACT inhaler Inhale 2 puffs into the lungs every 6 (six) hours as needed.   ALPRAZolam (XANAX) 1 MG tablet Take 1 tablet (1 mg total) by mouth 3 (three) times daily as needed for anxiety.   busPIRone (BUSPAR) 7.5 MG tablet Take 1-2 tablets (7.5-15 mg total) by mouth 2 (two) times daily.   Melatonin 10 MG TABS Take 20 mg by mouth at bedtime.   metoprolol succinate (TOPROL-XL) 50 MG 24 hr tablet Take 1.5 tablets (75 mg total) by mouth daily. (Patient taking differently: Take 50 mg by mouth daily.)   Mometasone Furoate (ASMANEX HFA) 100 MCG/ACT AERO Inhale 2 puffs into the lungs 2 (two) times daily.   triamcinolone (NASACORT) 55 MCG/ACT AERO nasal inhaler USE 2 SPRAYS IN EACH NOSTRIL EVERY DAY   valACYclovir (VALTREX) 1000 MG tablet Take  2 tablets (2,000 mg total) by mouth 2 (two) times daily.   lovastatin (MEVACOR) 20 MG tablet Take 1 tablet (20 mg total) by mouth at bedtime.   omeprazole (PRILOSEC) 40 MG capsule Take 1 capsule (40 mg total) by mouth daily 30 minutes prior to main meal of day   SEMAGLUTIDE, 1 MG/DOSE, [place] Inject 1 mg into the skin once a week.   sucralfate (CARAFATE) 1 g tablet TAKE ONE TABLET BY MOUTH FOUR TIMES A DAY WITH MEALS AND AT BEDTIME AS NEEDED   No facility-administered medications prior to visit.    Review of Systems  Constitutional:  Negative for appetite change, chills  and fever.  Respiratory:  Negative for chest tightness, shortness of breath and wheezing.   Cardiovascular:  Negative for chest pain and palpitations.  Gastrointestinal:  Negative for abdominal pain, nausea and vomiting.   Last metabolic panel Lab Results  Component Value Date   GLUCOSE 129 (H) 07/14/2020   NA 139 08/27/2021   K 4.0 07/14/2020   CL 103 08/27/2021   CO2 18 08/27/2021   BUN 17 08/27/2021   CREATININE 1.0 08/27/2021   EGFR 69 08/27/2021   CALCIUM 9.0 08/27/2021   PROT 6.7 07/14/2020   ALBUMIN 4.5 08/27/2021   LABGLOB 2.3 07/14/2020   AGRATIO 1.9 07/14/2020   BILITOT <0.2 07/14/2020   ALKPHOS 77 07/14/2020   AST 21 07/14/2020   ALT 28 07/14/2020   ANIONGAP 8 06/27/2017   Last lipids Lab Results  Component Value Date   CHOL 192 08/27/2021   HDL 27 (A) 08/27/2021   LDLCALC 119 08/27/2021   TRIG 23 (A) 08/27/2021   CHOLHDL 6.0 (H) 07/14/2020   Last hemoglobin A1c Lab Results  Component Value Date   HGBA1C 5.5 09/28/2021       Objective    BP 132/79 (BP Location: Left Arm, Patient Position: Sitting, Cuff Size: Normal)   Pulse (!) 59   Resp 16   Ht 5\' 7"  (1.702 m)   Wt 237 lb 12.8 oz (107.9 kg)   BMI 37.24 kg/m    Physical Exam  General Appearance:    Obese male. Alert, cooperative, in no acute distress, appears stated age  Head:    Normocephalic, without obvious abnormality,  atraumatic  Eyes:    PERRL, conjunctiva/corneas clear, EOM's intact, fundi    benign, both eyes       Ears:    Normal TM's and external ear canals, both ears  Nose:   Nares normal, septum midline, mucosa normal, no drainage   or sinus tenderness  Throat:   Lips, mucosa, and tongue normal; teeth and gums normal  Neck:   Supple, symmetrical, trachea midline, no adenopathy;       thyroid:  No enlargement/tenderness/nodules; no carotid   bruit or JVD  Back:     Symmetric, no curvature, ROM normal, no CVA tenderness  Lungs:     Clear to auscultation bilaterally, respirations unlabored  Chest wall:    No tenderness or deformity  Heart:    Bradycardic. Normal rhythm. No murmurs, rubs, or gallops.  S1 and S2 normal  Abdomen:     Soft, non-tender, bowel sounds active all four quadrants,    no masses, no organomegaly  Genitalia:    deferred  Rectal:    deferred  Extremities:   All extremities are intact. No cyanosis or edema  Pulses:   2+ and symmetric all extremities  Skin:   Skin color, texture, turgor normal, no rashes or lesions  Lymph nodes:   Cervical, supraclavicular, and axillary nodes normal  Neurologic:   CNII-XII intact. Normal strength, sensation and reflexes      throughout       Last depression screening scores    05/13/2023    8:21 AM 10/15/2022    1:55 PM 07/16/2022    1:13 PM  PHQ 2/9 Scores  PHQ - 2 Score  2 2  PHQ- 9 Score  10 10  Exception Documentation Patient refusal     Last fall risk screening    05/13/2023    8:21 AM  Fall Risk   Falls in the past year? 0  Number falls in past yr: 0  Injury with Fall? 0  Risk for fall due to : No Fall Risks   Last Audit-C alcohol use screening    05/11/2023    1:41 PM  Alcohol Use Disorder Test (AUDIT)  1. How often do you have a drink containing alcohol? 0  3. How often do you have six or more drinks on one occasion? 0   A score of 3 or more in women, and 4 or more in men indicates increased risk for alcohol abuse,  EXCEPT if all of the points are from question 1   No results found for any visits on 05/13/23.  Assessment & Plan    Routine Health Maintenance and Physical Exam  Exercise Activities and Dietary recommendations  Goals   None     Immunization History  Administered Date(s) Administered   Influenza,inj,Quad PF,6+ Mos 11/26/2016, 04/25/2018   Tdap 03/25/2010    Health Maintenance  Topic Date Due   Pneumococcal Vaccine 28-49 Years old (1 of 2 - PCV) Never done   HIV Screening  Never done   Hepatitis C Screening  Never done   DTaP/Tdap/Td (2 - Td or Tdap) 03/25/2020   COVID-19 Vaccine (1 - 2024-25 season) Never done   Colonoscopy  Never done   INFLUENZA VACCINE  06/20/2023 (Originally 10/21/2022)   HPV VACCINES  Aged Out    Discussed health benefits of physical activity, and encouraged him to engage in regular exercise appropriate for his age and condition.     Weight Loss Significant weight loss achieved through semaglutide use and lifestyle modifications including diet and exercise. Semaglutide discontinued since November due to rapid weight loss and patient's desire to maintain weight independently. Continued use of semaglutide is being discouraged by family members.  -Encouraged continued healthy lifestyle habits including regular exercise and balanced diet. -Consider re-initiation of semaglutide if significant weight regain occurs. Last recorded height was 5\' 7" . Weight goal for a BMI of 30 is 191.6 pounds.  Anxiety Reports of occasional waves of anxiety managed with Xanax and Buspar. -Continue current regimen. -Encouraged non-pharmacological methods of managing anxiety such as exercise.  Hypertension Well-controlled with metoprolol. -Continue metoprolol 1 tablet daily.  Smoking Still smoking but reduced frequency. -Encouraged to continue efforts to quit smoking.  Colon Cancer Screening Patient is 48 years old and due for initial screening. -Order Cologuard test as  patient prefers this over colonoscopy.  Testosterone Patient expressed interest in checking testosterone levels. -Order testosterone level.  General Health Maintenance -Continue Vitamin D supplementation as currently taking. -Check cholesterol and triglyceride levels. -Check complete blood count and metabolic panel.          Mila Merry, MD  [facility] [phone] (phone) [phone] (fax)  [facility]

## 2023-05-14 LAB — TESTOSTERONE: Testosterone: 862 ng/dL (ref 264–916)

## 2023-05-14 MED ORDER — ALPRAZOLAM 1 MG PO TABS
1.0000 mg | ORAL_TABLET | Freq: Three times a day (TID) | ORAL | 1 refills | Status: DC | PRN
Start: 2023-05-14 — End: 2023-07-14

## 2023-05-27 ENCOUNTER — Other Ambulatory Visit: Payer: Self-pay

## 2023-05-27 DIAGNOSIS — E782 Mixed hyperlipidemia: Secondary | ICD-10-CM

## 2023-05-27 DIAGNOSIS — Z1159 Encounter for screening for other viral diseases: Secondary | ICD-10-CM

## 2023-05-27 DIAGNOSIS — R739 Hyperglycemia, unspecified: Secondary | ICD-10-CM

## 2023-05-27 DIAGNOSIS — E559 Vitamin D deficiency, unspecified: Secondary | ICD-10-CM

## 2023-05-27 DIAGNOSIS — I10 Essential (primary) hypertension: Secondary | ICD-10-CM

## 2023-07-13 ENCOUNTER — Other Ambulatory Visit: Payer: Self-pay | Admitting: Family Medicine

## 2023-07-13 DIAGNOSIS — F411 Generalized anxiety disorder: Secondary | ICD-10-CM

## 2023-08-10 ENCOUNTER — Other Ambulatory Visit: Payer: Self-pay | Admitting: Family Medicine

## 2023-08-10 DIAGNOSIS — F411 Generalized anxiety disorder: Secondary | ICD-10-CM

## 2023-09-13 ENCOUNTER — Other Ambulatory Visit: Payer: Self-pay | Admitting: Family Medicine

## 2023-09-13 DIAGNOSIS — F411 Generalized anxiety disorder: Secondary | ICD-10-CM

## 2023-10-14 ENCOUNTER — Other Ambulatory Visit: Payer: Self-pay | Admitting: Family Medicine

## 2023-10-14 DIAGNOSIS — F411 Generalized anxiety disorder: Secondary | ICD-10-CM

## 2023-10-20 ENCOUNTER — Other Ambulatory Visit: Payer: Self-pay | Admitting: Family Medicine

## 2023-10-20 DIAGNOSIS — F411 Generalized anxiety disorder: Secondary | ICD-10-CM

## 2023-11-28 ENCOUNTER — Other Ambulatory Visit: Payer: Self-pay | Admitting: Family Medicine

## 2023-11-28 DIAGNOSIS — F411 Generalized anxiety disorder: Secondary | ICD-10-CM

## 2024-01-20 ENCOUNTER — Other Ambulatory Visit: Payer: Self-pay | Admitting: Family Medicine

## 2024-01-20 DIAGNOSIS — F411 Generalized anxiety disorder: Secondary | ICD-10-CM

## 2024-02-02 LAB — BASIC METABOLIC PANEL WITH GFR
BUN: 10 (ref 4–21)
Creatinine: 0.1 — AB (ref 0.6–1.3)
Glucose: 87
Potassium: 4.1 meq/L (ref 3.5–5.1)
Sodium: 138 (ref 137–147)

## 2024-02-02 LAB — CBC AND DIFFERENTIAL
HCT: 44 (ref 41–53)
Hemoglobin: 15.6 (ref 13.5–17.5)
Platelets: 240 K/uL (ref 150–400)
WBC: 14.2

## 2024-02-02 LAB — COMPREHENSIVE METABOLIC PANEL WITH GFR: eGFR: 109

## 2024-02-02 LAB — HEPATIC FUNCTION PANEL
ALT: 15 U/L (ref 10–40)
AST: 14 (ref 14–40)
Alkaline Phosphatase: 59 (ref 25–125)
Bilirubin, Total: 0.5

## 2024-02-02 LAB — LIPID PANEL
Cholesterol: 193 (ref 0–200)
HDL: 36 (ref 35–70)
LDL Cholesterol: 130
Triglycerides: 152 (ref 40–160)

## 2024-02-02 LAB — VITAMIN D 25 HYDROXY (VIT D DEFICIENCY, FRACTURES): Vit D, 25-Hydroxy: 37.2

## 2024-02-02 LAB — VITAMIN B12: Vitamin B-12: 768

## 2024-02-02 LAB — PSA: PSA: 0.3

## 2024-02-02 LAB — HEMOGLOBIN A1C: Hemoglobin A1C: 4.8

## 2024-02-03 ENCOUNTER — Encounter: Payer: Self-pay | Admitting: Family Medicine

## 2024-02-03 ENCOUNTER — Ambulatory Visit: Payer: Self-pay | Admitting: Family Medicine

## 2024-02-03 VITALS — BP 111/85 | HR 91 | Ht 67.5 in | Wt 203.0 lb

## 2024-02-03 DIAGNOSIS — Z720 Tobacco use: Secondary | ICD-10-CM

## 2024-02-03 DIAGNOSIS — F411 Generalized anxiety disorder: Secondary | ICD-10-CM

## 2024-02-03 DIAGNOSIS — E782 Mixed hyperlipidemia: Secondary | ICD-10-CM

## 2024-02-03 MED ORDER — AMOXICILLIN-POT CLAVULANATE 875-125 MG PO TABS: 1.0000 | ORAL_TABLET | Freq: Two times a day (BID) | ORAL | 0 refills | Status: AC

## 2024-02-24 NOTE — Progress Notes (Signed)
 Established patient visit   Patient: Todd Chen   DOB: 04-23-1975   48 y.o. Male  MRN: [id number] Visit Date: 02/03/2024  Today's healthcare provider: Nancyann Perry, MD   Chief Complaint  Patient presents with   Medical Management of Chronic Issues    Anxiety follow up   Subjective    Discussed the use of AI scribe software for clinical note transcription with the patient, who gave verbal consent to proceed.  History of Present Illness   Todd Chen is a 48 year old male who presents with an abscess on his leg.  He states he used to have an abscess on his leg for over a year and a half. Initially, it was the size of a golf ball and located deep in his leg. He has been treated with at least two courses of antibiotics, which helped reduce its size, and Valtrex , which also provided some relief. Recently, the abscess has come to the surface and began draining about a week and a half ago.  He has experienced significant weight loss, which he attributes to dietary changes, including eliminating carbohydrates and eating mainly meat and vegetables. His partner advised him to stop losing weight when he reached 230 pounds. He has not been using medications like Ozempic for weight loss.  He manages anxiety and depression with Buspar , taken in the morning and at night. He has significantly reduced his Xanax  use, now only taking it at night to aid sleep. Buspar  has helped with his anxiety but has also made him feel 'filterless' and more open, which he finds challenging. 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  He has a history of ADHD and has been trying to manage his mental health by reducing Xanax  use, which he believes has affected his memory. He takes melatonin and Tylenol  PM to aid sleep and has recently started eating apples, which he feels boosts his  metabolism.  He reports a strained family dynamic, particularly with his mother and brothers, which contributes to his stress and anxiety. He has not consumed alcohol since his father's death and feels better for it, despite being the only one in his family who abstains.      Medications: Outpatient Medications Prior to Visit  Medication Sig Note   albuterol  (VENTOLIN  HFA) 108 (90 Base) MCG/ACT inhaler Inhale 2 puffs into the lungs every 6 (six) hours as needed.    ALPRAZolam  (XANAX ) 1 MG tablet TAKE 1 TABLET(1 MG) BY MOUTH THREE TIMES DAILY AS NEEDED FOR ANXIETY (Patient taking differently: Take 1-3 mg by mouth at bedtime.)    busPIRone  (BUSPAR ) 7.5 MG tablet TAKE 1 TO 2 TABLETS(7.5 TO 15 MG) BY MOUTH TWICE DAILY    Melatonin 10 MG TABS Take 20 mg by mouth at bedtime.    metoprolol  succinate (TOPROL -XL) 50 MG 24 hr tablet Take 1.5 tablets (75 mg total) by mouth daily. (Patient taking differently: Take 50 mg by mouth daily.)    Mometasone  Furoate (ASMANEX  HFA) 100 MCG/ACT AERO Inhale 2 puffs into the lungs 2 (two) times daily.    triamcinolone  (NASACORT ) 55 MCG/ACT AERO nasal inhaler USE 2 SPRAYS IN EACH NOSTRIL EVERY DAY    valACYclovir  (VALTREX ) 1000 MG tablet Take 2 tablets (2,000 mg total) by mouth 2 (two) times daily.    [DISCONTINUED] lovastatin  (MEVACOR ) 20 MG tablet Take 1 tablet (20 mg total) by  mouth at bedtime. 02/03/2024: no longer needed   [DISCONTINUED] omeprazole  (PRILOSEC) 40 MG capsule Take 1 capsule (40 mg total) by mouth daily 30 minutes prior to main meal of day    [DISCONTINUED] SEMAGLUTIDE, 1 MG/DOSE, [place] Inject 1 mg into the skin once a week.    [DISCONTINUED] sucralfate  (CARAFATE ) 1 g tablet TAKE ONE TABLET BY MOUTH FOUR TIMES A DAY WITH MEALS AND AT BEDTIME AS NEEDED    No facility-administered medications prior to visit.      Objective    BP 111/85 (BP Location: Left Arm, Patient Position: Sitting, Cuff Size: Normal)   Pulse 91   Ht 5' 7.5 (1.715 m)   Wt 203 lb  (92.1 kg)   SpO2 100%   BMI 31.33 kg/m   Physical Exam   General appearance: Overweight male, cooperative and in no acute distress Head: Normocephalic, without obvious abnormality, atraumatic Respiratory: Respirations even and unlabored, normal respiratory rate Extremities: All extremities are intact.  Skin: Skin color, texture, turgor normal. No rashes seen  Psych: Appropriate mood and affect. Neurologic: Mental status: Alert, oriented to person, place, and time, thought content appropriate.   ([facility] labs ordered from another facility) Results for orders placed or performed in visit on 02/03/24  CBC and differential  Result Value Ref Range   Hemoglobin 15.6 13.5 - 17.5   HCT 44 41 - 53   Platelets 240 150 - 400 K/uL   WBC 14.2   VITAMIN D  25 Hydroxy (Vit-D Deficiency, Fractures)  Result Value Ref Range   Vit D, 25-Hydroxy 37.2   Basic metabolic panel with GFR  Result Value Ref Range   Glucose 87    BUN 10 4 - 21   Creatinine 0.1 (A) 0.6 - 1.3   Potassium 4.1 3.5 - 5.1 mEq/L   Sodium 138 137 - 147  Comprehensive metabolic panel with GFR  Result Value Ref Range   eGFR 109   Lipid panel  Result Value Ref Range   Triglycerides 152 40 - 160   Cholesterol 193 0 - 200   HDL 36 35 - 70   LDL Cholesterol 130   Hepatic function panel  Result Value Ref Range   Alkaline Phosphatase 59 25 - 125   ALT 15 10 - 40 U/L   AST 14 14 - 40   Bilirubin, Total 0.5   Vitamin B12  Result Value Ref Range   Vitamin B-12 768   Hemoglobin A1c  Result Value Ref Range   Hemoglobin A1C 4.8   PSA  Result Value Ref Range   PSA 0.3      Assessment & Plan       Abscess of leg Chronic leg abscess, reduced in size with antibiotics - Prescribed Augmentin . - Advised to report if unresolved.  Generalized anxiety disorder and depression Chronic anxiety and depression managed with Buspar  and Xanax . Buspar  reduced Xanax  use but depression persists. Interested in therapy. - Continue Buspar   7.5 mg daily. - Continue Xanax  as needed for sleep. - Referred to therapist.  Mixed hyperlipidemia Cholesterol levels improved with dietary changes and weight loss.  Tobacco use Continues smoking, reduced with Buspar , acknowledges need to quit. - Encouraged smoking cessation.         Nancyann Perry, MD  [facility] [phone] (phone) [phone] (fax)  [facility]

## 2024-02-29 ENCOUNTER — Other Ambulatory Visit: Payer: Self-pay | Admitting: Family Medicine

## 2024-02-29 DIAGNOSIS — F411 Generalized anxiety disorder: Secondary | ICD-10-CM

## 2024-02-29 NOTE — Telephone Encounter (Signed)
 Duplicate request. Another has been sent to PCP for review

## 2024-03-05 ENCOUNTER — Encounter: Payer: Self-pay | Admitting: Family Medicine

## 2024-03-05 DIAGNOSIS — F411 Generalized anxiety disorder: Secondary | ICD-10-CM

## 2024-03-06 ENCOUNTER — Other Ambulatory Visit: Payer: Self-pay

## 2024-03-06 DIAGNOSIS — F411 Generalized anxiety disorder: Secondary | ICD-10-CM

## 2024-03-28 MED ORDER — ALPRAZOLAM 1 MG PO TABS: 1.0000 mg | ORAL_TABLET | Freq: Three times a day (TID) | ORAL | 1 refills | Status: AC | PRN

## 2024-04-09 ENCOUNTER — Other Ambulatory Visit: Payer: Self-pay | Admitting: Family Medicine

## 2024-04-09 DIAGNOSIS — F411 Generalized anxiety disorder: Secondary | ICD-10-CM

## 2024-05-14 ENCOUNTER — Encounter: Payer: Self-pay | Admitting: Family Medicine
